# Patient Record
Sex: Female | Born: 1970
Health system: Southern US, Community
[De-identification: ages and names within clinical notes are randomized; demographics above are authoritative.]

## PROBLEM LIST (undated history)

## (undated) DIAGNOSIS — T7840XA Allergy, unspecified, initial encounter: Secondary | ICD-10-CM

## (undated) DIAGNOSIS — G43909 Migraine, unspecified, not intractable, without status migrainosus: Secondary | ICD-10-CM

## (undated) DIAGNOSIS — K219 Gastro-esophageal reflux disease without esophagitis: Secondary | ICD-10-CM

## (undated) DIAGNOSIS — F419 Anxiety disorder, unspecified: Secondary | ICD-10-CM

## (undated) HISTORY — DX: Allergy, unspecified, initial encounter: T78.40XA

## (undated) HISTORY — DX: Anxiety disorder, unspecified: F41.9

## (undated) HISTORY — PX: RHINOPLASTY: SUR1284

## (undated) HISTORY — DX: Migraine, unspecified, not intractable, without status migrainosus: G43.909

## (undated) HISTORY — DX: Gastro-esophageal reflux disease without esophagitis: K21.9

---

## 1997-10-16 ENCOUNTER — Other Ambulatory Visit: Admission: RE | Admit: 1997-10-16 | Discharge: 1997-10-16 | Payer: Self-pay | Admitting: Gynecology

## 1997-11-08 ENCOUNTER — Inpatient Hospital Stay (HOSPITAL_COMMUNITY): Admission: AD | Admit: 1997-11-08 | Discharge: 1997-11-10 | Payer: Self-pay | Admitting: Gynecology

## 1997-11-08 ENCOUNTER — Inpatient Hospital Stay (HOSPITAL_COMMUNITY): Admission: AD | Admit: 1997-11-08 | Discharge: 1997-11-08 | Payer: Self-pay | Admitting: Gynecology

## 1998-01-01 ENCOUNTER — Other Ambulatory Visit: Admission: RE | Admit: 1998-01-01 | Discharge: 1998-01-01 | Payer: Self-pay | Admitting: Obstetrics and Gynecology

## 1999-02-10 ENCOUNTER — Other Ambulatory Visit: Admission: RE | Admit: 1999-02-10 | Discharge: 1999-02-10 | Payer: Self-pay | Admitting: Obstetrics and Gynecology

## 1999-10-22 ENCOUNTER — Other Ambulatory Visit: Admission: RE | Admit: 1999-10-22 | Discharge: 1999-10-22 | Payer: Self-pay | Admitting: Gynecology

## 1999-12-15 ENCOUNTER — Ambulatory Visit (HOSPITAL_COMMUNITY): Admission: RE | Admit: 1999-12-15 | Discharge: 1999-12-15 | Payer: Self-pay | Admitting: Obstetrics and Gynecology

## 1999-12-15 ENCOUNTER — Encounter: Payer: Self-pay | Admitting: Obstetrics and Gynecology

## 2000-02-24 ENCOUNTER — Ambulatory Visit (HOSPITAL_COMMUNITY): Admission: RE | Admit: 2000-02-24 | Discharge: 2000-02-24 | Payer: Self-pay | Admitting: Obstetrics and Gynecology

## 2000-05-06 ENCOUNTER — Encounter (INDEPENDENT_AMBULATORY_CARE_PROVIDER_SITE_OTHER): Payer: Self-pay

## 2000-05-06 ENCOUNTER — Inpatient Hospital Stay (HOSPITAL_COMMUNITY): Admission: AD | Admit: 2000-05-06 | Discharge: 2000-05-07 | Payer: Self-pay | Admitting: Gynecology

## 2000-06-17 ENCOUNTER — Other Ambulatory Visit: Admission: RE | Admit: 2000-06-17 | Discharge: 2000-06-17 | Payer: Self-pay | Admitting: Obstetrics and Gynecology

## 2001-05-11 HISTORY — PX: AUGMENTATION MAMMAPLASTY: SUR837

## 2002-05-10 ENCOUNTER — Other Ambulatory Visit: Admission: RE | Admit: 2002-05-10 | Discharge: 2002-05-10 | Payer: Self-pay | Admitting: Obstetrics and Gynecology

## 2003-04-17 ENCOUNTER — Encounter: Admission: RE | Admit: 2003-04-17 | Discharge: 2003-04-17 | Payer: Self-pay | Admitting: Family Medicine

## 2003-06-05 ENCOUNTER — Other Ambulatory Visit: Admission: RE | Admit: 2003-06-05 | Discharge: 2003-06-05 | Payer: Self-pay | Admitting: Obstetrics and Gynecology

## 2004-03-24 ENCOUNTER — Ambulatory Visit: Payer: Self-pay | Admitting: Family Medicine

## 2004-04-16 ENCOUNTER — Ambulatory Visit: Payer: Self-pay | Admitting: Family Medicine

## 2004-06-11 ENCOUNTER — Ambulatory Visit: Payer: Self-pay | Admitting: Family Medicine

## 2005-03-13 ENCOUNTER — Other Ambulatory Visit: Admission: RE | Admit: 2005-03-13 | Discharge: 2005-03-13 | Payer: Self-pay | Admitting: Gynecology

## 2005-06-19 ENCOUNTER — Ambulatory Visit: Payer: Self-pay | Admitting: Family Medicine

## 2006-02-25 ENCOUNTER — Ambulatory Visit: Payer: Self-pay | Admitting: Family Medicine

## 2006-05-11 HISTORY — PX: ENDOMETRIAL ABLATION: SHX621

## 2006-05-12 ENCOUNTER — Other Ambulatory Visit: Admission: RE | Admit: 2006-05-12 | Discharge: 2006-05-12 | Payer: Self-pay | Admitting: Gynecology

## 2006-08-26 ENCOUNTER — Ambulatory Visit (HOSPITAL_BASED_OUTPATIENT_CLINIC_OR_DEPARTMENT_OTHER): Admission: RE | Admit: 2006-08-26 | Discharge: 2006-08-26 | Payer: Self-pay | Admitting: Gynecology

## 2006-10-26 ENCOUNTER — Ambulatory Visit: Payer: Self-pay | Admitting: Family Medicine

## 2007-01-03 ENCOUNTER — Ambulatory Visit: Payer: Self-pay | Admitting: Vascular Surgery

## 2007-02-28 ENCOUNTER — Telehealth: Payer: Self-pay | Admitting: Family Medicine

## 2007-04-11 ENCOUNTER — Ambulatory Visit: Payer: Self-pay | Admitting: Vascular Surgery

## 2007-04-28 ENCOUNTER — Ambulatory Visit: Payer: Self-pay | Admitting: Vascular Surgery

## 2007-05-19 ENCOUNTER — Ambulatory Visit: Payer: Self-pay | Admitting: Vascular Surgery

## 2007-08-08 ENCOUNTER — Telehealth: Payer: Self-pay | Admitting: Family Medicine

## 2007-08-26 ENCOUNTER — Ambulatory Visit: Payer: Self-pay | Admitting: Family Medicine

## 2007-08-26 DIAGNOSIS — G4700 Insomnia, unspecified: Secondary | ICD-10-CM | POA: Insufficient documentation

## 2007-08-26 DIAGNOSIS — H6120 Impacted cerumen, unspecified ear: Secondary | ICD-10-CM

## 2007-08-26 DIAGNOSIS — R51 Headache: Secondary | ICD-10-CM

## 2007-08-26 DIAGNOSIS — F418 Other specified anxiety disorders: Secondary | ICD-10-CM

## 2007-08-26 DIAGNOSIS — R519 Headache, unspecified: Secondary | ICD-10-CM | POA: Insufficient documentation

## 2007-10-05 ENCOUNTER — Ambulatory Visit: Payer: Self-pay | Admitting: Family Medicine

## 2007-10-05 DIAGNOSIS — N3000 Acute cystitis without hematuria: Secondary | ICD-10-CM | POA: Insufficient documentation

## 2007-10-05 LAB — CONVERTED CEMR LAB
Bilirubin Urine: NEGATIVE
Blood in Urine, dipstick: NEGATIVE
Glucose, Urine, Semiquant: NEGATIVE
Ketones, urine, test strip: NEGATIVE
Nitrite: NEGATIVE
Protein, U semiquant: NEGATIVE
Specific Gravity, Urine: <1.005
Urobilinogen, UA: 0.2
WBC Urine, dipstick: NEGATIVE
pH: 7

## 2007-12-06 ENCOUNTER — Telehealth: Payer: Self-pay | Admitting: Family Medicine

## 2007-12-07 ENCOUNTER — Ambulatory Visit: Payer: Self-pay | Admitting: Family Medicine

## 2007-12-07 DIAGNOSIS — J1189 Influenza due to unidentified influenza virus with other manifestations: Secondary | ICD-10-CM

## 2008-01-18 ENCOUNTER — Ambulatory Visit: Payer: Self-pay | Admitting: Women's Health

## 2008-01-18 ENCOUNTER — Encounter: Payer: Self-pay | Admitting: Women's Health

## 2008-01-18 ENCOUNTER — Other Ambulatory Visit: Admission: RE | Admit: 2008-01-18 | Discharge: 2008-01-18 | Payer: Self-pay | Admitting: Gynecology

## 2008-02-27 ENCOUNTER — Telehealth: Payer: Self-pay | Admitting: Family Medicine

## 2008-03-19 ENCOUNTER — Telehealth: Payer: Self-pay | Admitting: Family Medicine

## 2008-05-25 ENCOUNTER — Telehealth: Payer: Self-pay | Admitting: Family Medicine

## 2008-05-25 ENCOUNTER — Ambulatory Visit: Payer: Self-pay | Admitting: Women's Health

## 2008-06-14 ENCOUNTER — Ambulatory Visit: Payer: Self-pay | Admitting: Women's Health

## 2008-06-15 ENCOUNTER — Ambulatory Visit: Payer: Self-pay | Admitting: Women's Health

## 2008-06-15 ENCOUNTER — Inpatient Hospital Stay (HOSPITAL_COMMUNITY): Admission: AD | Admit: 2008-06-15 | Discharge: 2008-06-15 | Payer: Self-pay | Admitting: Obstetrics and Gynecology

## 2008-06-18 ENCOUNTER — Ambulatory Visit: Payer: Self-pay | Admitting: Women's Health

## 2008-06-18 ENCOUNTER — Inpatient Hospital Stay (HOSPITAL_COMMUNITY): Admission: AD | Admit: 2008-06-18 | Discharge: 2008-06-18 | Payer: Self-pay | Admitting: Gynecology

## 2008-06-18 ENCOUNTER — Ambulatory Visit: Payer: Self-pay | Admitting: Gynecology

## 2008-06-21 ENCOUNTER — Ambulatory Visit: Payer: Self-pay | Admitting: Gynecology

## 2008-06-26 ENCOUNTER — Ambulatory Visit: Payer: Self-pay | Admitting: Gynecology

## 2008-06-26 ENCOUNTER — Encounter: Payer: Self-pay | Admitting: Gynecology

## 2008-06-26 ENCOUNTER — Ambulatory Visit (HOSPITAL_BASED_OUTPATIENT_CLINIC_OR_DEPARTMENT_OTHER): Admission: RE | Admit: 2008-06-26 | Discharge: 2008-06-27 | Payer: Self-pay | Admitting: Gynecology

## 2008-06-26 HISTORY — PX: VAGINAL HYSTERECTOMY: SUR661

## 2008-07-03 ENCOUNTER — Ambulatory Visit: Payer: Self-pay | Admitting: Gynecology

## 2008-07-11 ENCOUNTER — Ambulatory Visit: Payer: Self-pay | Admitting: Gynecology

## 2008-08-06 ENCOUNTER — Ambulatory Visit: Payer: Self-pay | Admitting: Gynecology

## 2008-09-06 ENCOUNTER — Telehealth: Payer: Self-pay | Admitting: Family Medicine

## 2008-10-30 ENCOUNTER — Ambulatory Visit: Payer: Self-pay | Admitting: Family Medicine

## 2008-10-30 DIAGNOSIS — F411 Generalized anxiety disorder: Secondary | ICD-10-CM

## 2009-03-04 ENCOUNTER — Encounter: Payer: Self-pay | Admitting: Family Medicine

## 2009-03-20 ENCOUNTER — Telehealth: Payer: Self-pay | Admitting: Family Medicine

## 2009-04-13 ENCOUNTER — Encounter (INDEPENDENT_AMBULATORY_CARE_PROVIDER_SITE_OTHER): Payer: Self-pay | Admitting: *Deleted

## 2009-11-22 ENCOUNTER — Ambulatory Visit: Payer: Self-pay | Admitting: Women's Health

## 2009-11-22 ENCOUNTER — Other Ambulatory Visit: Admission: RE | Admit: 2009-11-22 | Discharge: 2009-11-22 | Payer: Self-pay | Admitting: Gynecology

## 2010-04-18 ENCOUNTER — Telehealth: Payer: Self-pay | Admitting: Family Medicine

## 2010-04-24 ENCOUNTER — Ambulatory Visit: Payer: Self-pay | Admitting: Family Medicine

## 2010-06-01 ENCOUNTER — Encounter: Payer: Self-pay | Admitting: Family Medicine

## 2010-06-05 ENCOUNTER — Ambulatory Visit: Admit: 2010-06-05 | Payer: Self-pay | Admitting: Family Medicine

## 2010-06-12 ENCOUNTER — Ambulatory Visit (INDEPENDENT_AMBULATORY_CARE_PROVIDER_SITE_OTHER): Payer: 59 | Admitting: Women's Health

## 2010-06-12 DIAGNOSIS — R350 Frequency of micturition: Secondary | ICD-10-CM

## 2010-06-12 DIAGNOSIS — N644 Mastodynia: Secondary | ICD-10-CM

## 2010-06-12 NOTE — Assessment & Plan Note (Signed)
Summary: med check/refills/cjr   Vital Signs:  Patient profile:   40 year old female Weight:      108 pounds Temp:     98.5 degrees F oral BP sitting:   92 / 60  Vitals Entered By: Lynann Beaver CMA AAMA (April 24, 2010 8:31 AM) CC: refill meds Is Patient Diabetic? No Pain Assessment Patient in pain? no        History of Present Illness: Here to follow up on depression, insomnia, and migraines. She has been doing very well in general, and her HAs are much better lately, averaging about one a month. her moods are stable. She is doing yoga at home and eating a healthy diet.   Allergies: 1)  ! Codeine 2)  ! * Antihistamines  Past History:  Past Medical History: Reviewed history from 10/30/2008 and no changes required. Depression Headache insomnia sees Dr. Lily Peer or Maryelizabeth Rowan PA for gyn exams  Past Surgical History: Reviewed history from 10/30/2008 and no changes required. endometrial ablation 4-08 per Dr. Lily Peer breast augmentations hysterectomy with intact ovaries 2-10 per Dr. Lily Peer  Review of Systems  The patient denies anorexia, fever, weight loss, weight gain, vision loss, decreased hearing, hoarseness, chest pain, syncope, dyspnea on exertion, peripheral edema, prolonged cough, headaches, hemoptysis, abdominal pain, melena, hematochezia, severe indigestion/heartburn, hematuria, incontinence, genital sores, muscle weakness, suspicious skin lesions, transient blindness, difficulty walking, depression, unusual weight change, abnormal bleeding, enlarged lymph nodes, angioedema, breast masses, and testicular masses.    Physical Exam  General:  Well-developed,well-nourished,in no acute distress; alert,appropriate and cooperative throughout examination Neck:  No deformities, masses, or tenderness noted. Lungs:  Normal respiratory effort, chest expands symmetrically. Lungs are clear to auscultation, no crackles or wheezes. Heart:  Normal rate and regular  rhythm. S1 and S2 normal without gallop, murmur, click, rub or other extra sounds. Neurologic:  alert & oriented X3, cranial nerves II-XII intact, and gait normal.   Psych:  Cognition and judgment appear intact. Alert and cooperative with normal attention span and concentration. No apparent delusions, illusions, hallucinations   Impression & Recommendations:  Problem # 1:  ANXIETY DISORDER (ICD-300.00)  The following medications were removed from the medication list:    Lexapro 20 Mg Tabs (Escitalopram oxalate) ..... Once daily Her updated medication list for this problem includes:    Citalopram Hydrobromide 40 Mg Tabs (Citalopram hydrobromide) ..... Once daily  Problem # 2:  INSOMNIA (ICD-780.52)  Her updated medication list for this problem includes:    Temazepam 30 Mg Caps (Temazepam) .Marland Kitchen... At bedtime  Problem # 3:  DEPRESSION (ICD-311)  The following medications were removed from the medication list:    Lexapro 20 Mg Tabs (Escitalopram oxalate) ..... Once daily Her updated medication list for this problem includes:    Citalopram Hydrobromide 40 Mg Tabs (Citalopram hydrobromide) ..... Once daily  Problem # 4:  HEADACHE (ICD-784.0)  Her updated medication list for this problem includes:    Imitrex 100 Mg Tabs (Sumatriptan succinate) .Marland Kitchen... As needed  Complete Medication List: 1)  Imitrex 100 Mg Tabs (Sumatriptan succinate) .... As needed 2)  Temazepam 30 Mg Caps (Temazepam) .... At bedtime 3)  Citalopram Hydrobromide 40 Mg Tabs (Citalopram hydrobromide) .... Once daily  Other Orders: Admin 1st Vaccine (16109) Flu Vaccine 4yrs + (60454)  Patient Instructions: 1)  set up fasting labs soon Prescriptions: CITALOPRAM HYDROBROMIDE 40 MG TABS (CITALOPRAM HYDROBROMIDE) once daily  #30 x 11   Entered and Authorized by:   Nelwyn Salisbury MD  Signed by:   Nelwyn Salisbury MD on 04/24/2010   Method used:   Print then Give to Patient   RxID:   [id number] TEMAZEPAM 30 MG CAPS  (TEMAZEPAM) at bedtime  #30 x 5   Entered and Authorized by:   Nelwyn Salisbury MD   Signed by:   Nelwyn Salisbury MD on 04/24/2010   Method used:   Print then Give to Patient   RxID:   [id number] IMITREX 100 MG TABS (SUMATRIPTAN SUCCINATE) as needed  #9 x 11   Entered and Authorized by:   Nelwyn Salisbury MD   Signed by:   Nelwyn Salisbury MD on 04/24/2010   Method used:   Print then Give to Patient   RxID:   [id number]    Orders Added: 1)  Est. Patient Level IV [53664] 2)  Admin 1st Vaccine [40347] 3)  Flu Vaccine 9yrs + [42595] Flu Vaccine Consent Questions     Do you have a history of severe allergic reactions to this vaccine? no    Any prior history of allergic reactions to egg and/or gelatin? no    Do you have a sensitivity to the preservative Thimersol? no    Do you have a past history of Guillan-Barre Syndrome? no    Do you currently have an acute febrile illness? no    Have you ever had a severe reaction to latex? no    Vaccine information given and explained to patient? yes    Are you currently pregnant? no    Lot Number:[id number]   Exp Date:11/08/2010   Site Given  Left Deltoid IMn 1st Vaccine [90471] 3)  Flu Vaccine 105yrs + [63875]     .lbflu

## 2010-06-12 NOTE — Progress Notes (Signed)
Summary: Pt sch ov. Needs refill of Temazepam asap to [facility]  Phone Note Call from Patient Call back at Work Phone [phone]   Caller: Patient Summary of Call: Pt has sch ov for 04/24/10. Only has to Temazepam lft. Pls call in refill to [facility] at [employer].  Initial call taken by: Lucy Antigua,  April 18, 2010 8:52 AM  Follow-up for Phone Call        #30 only. OV as previously noted Follow-up by: Edwyna Perfect MD,  April 18, 2010 9:16 AM  Additional Follow-up for Phone Call Additional follow up Details #1::        done pt aware.  Additional Follow-up by: Pura Spice, RN,  April 18, 2010 9:21 AM

## 2010-07-15 ENCOUNTER — Ambulatory Visit (INDEPENDENT_AMBULATORY_CARE_PROVIDER_SITE_OTHER): Payer: 59 | Admitting: Family Medicine

## 2010-07-15 ENCOUNTER — Encounter: Payer: Self-pay | Admitting: Family Medicine

## 2010-07-15 ENCOUNTER — Telehealth: Payer: Self-pay | Admitting: Family Medicine

## 2010-07-15 DIAGNOSIS — F411 Generalized anxiety disorder: Secondary | ICD-10-CM

## 2010-07-15 DIAGNOSIS — F419 Anxiety disorder, unspecified: Secondary | ICD-10-CM

## 2010-07-15 DIAGNOSIS — R319 Hematuria, unspecified: Secondary | ICD-10-CM

## 2010-07-15 DIAGNOSIS — N390 Urinary tract infection, site not specified: Secondary | ICD-10-CM

## 2010-07-15 LAB — POCT URINALYSIS DIPSTICK
Bilirubin, UA: NEGATIVE
Glucose, UA: NEGATIVE
Ketones, UA: NEGATIVE
Nitrite, UA: NEGATIVE
Spec Grav, UA: 1.02
Urobilinogen, UA: 0.2
pH, UA: 7

## 2010-07-15 MED ORDER — CIPROFLOXACIN HCL 500 MG PO TABS: 500.0000 mg | ORAL_TABLET | Freq: Two times a day (BID) | ORAL | Status: AC

## 2010-07-15 MED ORDER — FLUCONAZOLE 150 MG PO TABS: 150.0000 mg | ORAL_TABLET | Freq: Once | ORAL | Status: AC

## 2010-07-15 MED ORDER — ESCITALOPRAM OXALATE 20 MG PO TABS: 20.0000 mg | ORAL_TABLET | Freq: Every day | ORAL | Status: DC

## 2010-07-15 NOTE — Telephone Encounter (Signed)
Ok to work in at [employer]

## 2010-07-15 NOTE — Telephone Encounter (Signed)
Pt called and said that there is blood in urine. Discomfort. Ovaries sore. Pt req work in appt.

## 2010-07-15 NOTE — Telephone Encounter (Signed)
lft vm for pt stating that Dr Clent Ridges has agreed to see pt today at 3:30pm. Pt has been schd for this time and is suppose to call back to confirm appt.

## 2010-07-15 NOTE — Progress Notes (Signed)
  Subjective:    Patient ID: Linda [name], female    DOB: 10-14-1970, 40 y.o.   MRN: [id number]  HPI    Review of Systems  Constitutional: Negative.   Gastrointestinal: Negative.   Genitourinary: Positive for urgency and frequency. Negative for dysuria, flank pain, vaginal pain and pelvic pain.  Psychiatric/Behavioral: Positive for sleep disturbance. The patient is nervous/anxious.    Here for several issues. First for 3 days she has seen blood in the urine. There is some urgency to urinate but no burning or pain. No fever or pain. Drinking water. Also she has been taking Celexa for anxiety for awhile but she is not a ssatisfied with this as she was when taking Lexapro. She wants to go back to this.     Objective:   Physical Exam  Constitutional: She appears well-developed and well-nourished.  Abdominal: Soft. Bowel sounds are normal. She exhibits no distension and no mass. There is no tenderness. There is no rebound and no guarding.  Psychiatric: She has a normal mood and affect. Her behavior is normal. Judgment and thought content normal.          Assessment & Plan:  She has a UTI, treat with antibiotic. Switch back to Lexapro.

## 2010-08-06 ENCOUNTER — Other Ambulatory Visit: Payer: Self-pay | Admitting: Family Medicine

## 2010-08-06 DIAGNOSIS — Z1231 Encounter for screening mammogram for malignant neoplasm of breast: Secondary | ICD-10-CM

## 2010-08-18 ENCOUNTER — Ambulatory Visit
Admission: RE | Admit: 2010-08-18 | Discharge: 2010-08-18 | Disposition: A | Discharge status: Home / Self Care | Payer: 59 | Source: Ambulatory Visit | Attending: Family Medicine | Admitting: Family Medicine

## 2010-08-18 DIAGNOSIS — Z1231 Encounter for screening mammogram for malignant neoplasm of breast: Secondary | ICD-10-CM

## 2010-08-26 LAB — CREATININE, SERUM
Creatinine, Ser: 0.5 mg/dL (ref 0.4–1.2)
GFR calc Af Amer: >60 mL/min (ref >60)
GFR calc non Af Amer: >60 mL/min (ref >60)

## 2010-08-26 LAB — CBC
Hemoglobin: 12.3 g/dL (ref 12.0–15.0)
Hemoglobin: 10.6 g/dL — ABNORMAL LOW (ref 12.0–15.0)
MCHC: 33.8 g/dL (ref 30.0–36.0)
RBC: 3.99 MIL/uL (ref 3.87–5.11)
RBC: 3.42 MIL/uL — ABNORMAL LOW (ref 3.87–5.11)
RDW: 13.3 % (ref 11.5–15.5)

## 2010-08-26 LAB — DIFFERENTIAL
Basophils Absolute: 0.4 10*3/uL — ABNORMAL HIGH (ref 0.0–0.1)
Basophils Relative: 3 % — ABNORMAL HIGH (ref 0–1)
Lymphocytes Relative: 14 % (ref 12–46)
Monocytes Absolute: 0.5 10*3/uL (ref 0.1–1.0)
Monocytes Relative: 4 % (ref 3–12)
Neutro Abs: 9.5 10*3/uL — ABNORMAL HIGH (ref 1.7–7.7)
Neutrophils Relative %: 79 % — ABNORMAL HIGH (ref 43–77)

## 2010-08-26 LAB — AST: AST: 15 U/L (ref 0–37)

## 2010-08-26 LAB — BUN: BUN: 8 mg/dL (ref 6–23)

## 2010-09-23 NOTE — Op Note (Signed)
Linda [name], Linda [name]              ACCOUNT NO.:  [id number]   MEDICAL RECORD NO.:  [id number]          PATIENT TYPE:  AMB   LOCATION:  [facility]                         FACILITY:  [facility]   PHYSICIAN:  Juan H. Lily Peer, M.D.DATE OF BIRTH:  1970/09/09   DATE OF PROCEDURE:  DATE OF DISCHARGE:                               OPERATIVE REPORT   FIRST ASSISTANT:  Timothy P. Fontaine, M.D.   INDICATIONS FOR OPERATION:  A 40 year old gravida 3, para 2, currently 8  to [redacted] weeks pregnant after an endometrial ablation 2 years ago.  Patient  had undergone medical elective termination attempts x2 and surgical  attempt for D&E, unsuccessful.  Patient was found with quantitative beta  hCGs as an outpatient.  Her titer started to decrease.  Right adnexal  cystic mass had been noted despite an intrauterine gestational sac with  a yolk-sac had been seen.  The patient wanted definitive surgery, did  not want to continue with the methotrexate and is scheduled to undergo a  laparoscopic-assisted vaginal hysterectomy, bilateral salpingectomy.  Surgery also been informed to rule out heterotropic pregnancy.   PREOPERATIVE DIAGNOSES:  1. Rule out missed abortion.  2. Rule out chronic ectopic.  3. Rule out heterotropic pregnancy.   POSTOPERATIVE DIAGNOSES:  1. Rule out missed abortion.  2. Rule out chronic ectopic.  3. Rule out heterotropic pregnancy.   ANESTHESIA:  General endotracheal anesthesia.   PROCEDURE PERFORMED:  Laparoscopic-assisted vaginal hysterectomy,  bilateral salpingectomy.   FINDINGS:  Patient with an 8-week size uterus and normal-appearing  fallopian tubes bilaterally sitting in the sub-cul-de-sac near the right  adnexa was a 3 x 5 cm ovoid mass, hemorrhagic blue tensed in appearance,  further this could be infarcted appendices epiploicae or an ectopic.  The mass was not adhered to the fallopian tube that was floating in the  cul-de-sac.  Otherwise, normal-appearing tubes bilaterally.   Normal-  appearing ovaries.  Normal-appearing liver surface area and gallbladder.  Appendix not visualized.   DESCRIPTION OF OPERATION:  After the patient was adequately counseled,  she was taken to the operating room where she underwent a successful  general endotracheal anesthesia.  Patient received a gram of cefotetan  for prophylaxis and had PSA stockings for DVT prophylaxis as well.  A  Foley catheter had been inserted to monitor urinary output during the  surgery.  The abdomen was prepped and draped in usual sterile fashion.  The legs were placed in low lithotomy position, and a small semilunar  incision was made the underneath the umbilicus followed by insertion of  10/11 mm operative laparoscope with the Optiview and the peritoneal  cavity was entered cautiously.  A pneumoperitoneum was established with  approximately 3 liters of carbon dioxide.  Two additional 5-mm ports  were placed under laparoscopic guidance in the right lower left abdomen.  A systematic infant inspection demonstrated the above-mentioned  findings.  The right utero-ovarian ligament was identified and with the  use of harmonic scalpel, the right utero-ovarian ligament was coapted  and transected as was the proximal fallopian tube and the broad and  cardinal ligaments and a bladder  flap was established to the lower  uterine segment.  Similar procedure was carried out on the contralateral  side and after both uterine arteries had been coapted and transected,  the procedure was then completed transvaginally.  Of note, the 3 x 5 cm  ovoid mass was left in the cul-de-sac to be retrieved during the vaginal  hysterectomy portion.  The patient's leg was then placed in high  lithotomy position.  A short weighted billed speculum was placed in  posterior vaginal vault.  The anterior and posterior cervical lip was  grasped with a Lahey thyroid clamp, 1% lidocaine with 1:100,000  epinephrine was infiltrated to the  cervicovaginal mucosa in a  circumferential manner.  A circumferential incision was made with a  scalpel.  A posterior colpotomy was established.  The long weighted  billed speculum was interchanged for the short, and both uterosacral  ligaments were clamped, cut, and suture ligated with 0-Vicryl suture and  tagged.  The anterior peritoneum was incised in effort to enter the  anterior cul-de-sac and the deeper retractor was in place.  The  remaining broad and cardinal ligaments were serially clamped, cut, and  suture ligated with 0-Vicryl suture, and the uterus and cervix was  passed off the operative field as well as the specimen of the floating 3  x 5 cm ovoid mass.  Each were properly identified for pathological  evaluation.  The vagina was irrigated copiously with normal saline  solution.  Both pedicles were assessed and were normal.  The posterior  vaginal mucosa and peritoneum cuff from 6 to 12 o'clock position were  closed with a running locking stitch of 0-Vicryl suture.  Then, the cuff  was closed with interrupted sutures of 0-Vicryl suture.  The vagina was  once again irrigated with normal saline solution.  Attention was then  placed to the laparoscopic portion.  Once again brought down to low  lithotomy position and the pneumoperitoneum was reestablished.  A  systematic inspection demonstrated both pedicles appeared to be dry as  some of the vaginal cuff was slightly friable so Arista hemostatic  particles were placed in the area of the vaginal cuff after the pelvic  cavity been copiously irrigated with normal saline solution; but of  note, prior to doing this both fallopian tubes were excised with the  harmonic scalpel after coapting and transecting up in the mesosalpinx  and retrieved individually and identified according.  The  pneumoperitoneum was removed.  The subumbilical fascial incision was not  reapproximated, and the skin was reapproximated with Xeroform gauze as   well as both 5-mm ports; and of note for low hemostasis, some of the  Arista hemostatic particles had been placed before the Dermabond was  placed.  For postoperative analgesia, 0.25% Marcaine was infiltrated on  all three port sites for a total of 10 cc.  Sponge count and needle  count were correct.  Blood loss from procedure was 275 cc.  Urine output  250 cc.  Lactated Ringer's 1200 cc.  The uterine weight was 137.1 grams.      Juan H. Lily Peer, M.D.  Electronically Signed     JHF/MEDQ  D:  06/26/2008  T:  06/26/2008  Job:  [id number]

## 2010-09-23 NOTE — Consult Note (Signed)
VASCULAR SURGERY CONSULTATION   [name], [name]  DOB:  [birth year]                                       01/03/2007  EAVWU#:[id number]   HISTORY OF PRESENT ILLNESS:  The patient is a 40 year old female who has  been having progressive symptomatic varicosities in both legs over the  last several years.  She notices a throbbing aching discomfort when she  is sitting or standing, particularly in the posterior calf and behind  the knees with the right worse than the left.  This becomes  progressively worsen the more she stands and sits, which her job  requires.  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  She has no swelling in her  ankles and feet and has no history of bleeding or ulcerations,  thrombophlebitis or deep venous thrombosis, but primarily pain from  these varicosities and spider veins.  She recently was evaluated at the  [facility] and had an ultrasound study, but does not have that  report with her today.  She has not work elastic compression stockings  (full length), but has wrapped her legs with Ace wraps with some  improvement.  She also gets some improvement with elevation of the legs  and taking ibuprofen.   PAST MEDICAL HISTORY:  Negative for diabetes, coronary artery disease,  stroke, hyperlipidemia or COPD.   PAST SURGICAL HISTORY:  Breast augmentation surgery.   FAMILY HISTORY:  Negative for coronary artery disease, diabetes and  stroke.   SOCIAL HISTORY:  She is married and has two children.  She works as a  Social research officer, government.  She does not use tobacco.  She drinks occasional  alcohol.   ALLERGIES:  None known.   REVIEW OF SYSTEMS:  Please health history form.   MEDICATIONS:  Please health history form.   PHYSICAL EXAMINATION:  VITAL SIGNS:  Blood pressure is 118/80.  Heart  rate 68.  Respirations 16.  GENERAL APPEARANCE:  She is a healthy middle-  aged female in no  apparent distress.  She is alert and oriented x3.  NECK:  Supple.  Carotid pulses 3+ palpable.  No bruits.  There is no  palpable adenopathy in the neck.  NEUROLOGIC EXAM:  Normal.  CHEST:  Clear to auscultation.  CARDIOVASCULAR EXAM:  Regular rhythm with no  murmurs.  ABDOMEN:  Soft and nontender with no masses palpable.  EXTREMITIES:  She has  3+ femoral, popliteal, dorsalis pedis and posterior tibial pulses  bilaterally.  There is no distal edema, hyperpigmentation or ulceration  noted.  The right leg has significant varicosities, particularly in the  distal thigh and the popliteal fossa posteriorly with some bulging.  There are extensive spider veins on the right leg in the lateral thigh,  medial thigh and posterior calf region.  On the left side, there are  some small varicosities in her popliteal area, but primarily spider  veins in the proximal, medial and lateral thigh as well as down the  lateral calf area.   ASSESSMENT:  I feel that she does have symptomatic varicosities with the  right worse than the left.  I do not have the results of her ultrasound  today to make any recommendations for treatment and she will obtain this  and I will then review this and decide  what options are best for this  young lady.   Quita Skye Hart Rochester, M.D.  Electronically Signed  JDL/MEDQ  D:  01/03/2007  T:  01/04/2007  Job:  [id number]   cc:   Tinnie Gens A. Tawanna Cooler, MD

## 2010-09-23 NOTE — Procedures (Signed)
LOWER EXTREMITY VENOUS REFLUX EXAM   INDICATION:  Bilateral lower extremity spider veins and varicose veins.   EXAM:  Using color-flow imaging and pulse Doppler spectral analysis, the  right and left common femoral, superficial femoral, popliteal, posterior  tibial, greater and lesser saphenous veins are evaluated.  There is no  evidence suggesting deep venous insufficiency in the right or left lower  extremity.   The right and left saphenofemoral junction are competent.  The left GSV  is competent to the level of the proximal calf.  There is an incompetent  tributary branch at the proximal calf which gives rise to a cluster of  varicose veins.   The right and left proximal short saphenous vein demonstrates  competency.   GSV Diameter (used if found to be incompetent only)                                            Right    Left  Proximal Greater Saphenous Vein           cm       cm  Proximal-to-mid-thigh                     cm       cm  Mid thigh                                 cm       cm  Mid-distal thigh                          cm       cm  Distal thigh                              cm       cm  Knee                                      cm       cm   IMPRESSION:  1. Right and left greater saphenous veins are competent bilaterally.  2. The right/left greater saphenous vein are not aneurysmal.  3. The right/left greater saphenous vein are not tortuous.  4. There is no evidence of significant deep venous insufficiency in      the left lower extremity.  5. The deep venous system is competent.  6. The right and left lesser saphenous vein are competent.   There is a cluster of right lower leg varicose veins which communicate  with the greater saphenous vein in two places in the proximal calf and  one at the ankle.   The cluster of varicose veins also communicates with the posterior  tibial vein by an incompetent perforator at the distal third of the   calf.   ___________________________________________  Quita Skye. Hart [place], M.D.   MC/MEDQ  D:  04/11/2007  T:  04/12/2007  Job:  [id number]

## 2010-09-23 NOTE — Assessment & Plan Note (Signed)
OFFICE VISIT   BERRY, Tonianne L  DOB:  1971-02-20                                       04/11/2007  ZOXWR#:[id number]   Ms. Linda [name] returns today for further evaluation of her painful  varicosities in the right leg. She has been wearing elastic compression  stockings from ankle to groin (20-30 mm) which have improved her  symptomatology slightly while standing, but she does continue to have  symptoms which are affecting her daily living. 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 She has tried ibuprofen  as well without relief. She had a venous Duplex exam performed in our  office today. No obvious reflux was noted in the right greater saphenous  vein from the groin to the knee. She does have a prominent branch  originating from the saphenous vein below the knee which seems to  communicate with the varicosities which are symptomatic in the right  pretibial region anteriorly. She also has painful varicosities in the  posterior proximal calf and popliteal area to extend into the distal  thigh. There is no reflux in the right small saphenous vein. She has a  large nest of spider veins in the thigh both medially and laterally and  no distal edema is noted today.   She is having significant pain with this varicosities which are  affecting her daily living, and I think they should be treated by  sclerotherapy at this time. This will require approximately three  sessions and we will proceed with precertification for this procedure to  treat these painful varicosities.   Quita Skye Hart [place], M.D.  Electronically Signed   JDL/MEDQ  D:  04/11/2007  T:  04/12/2007  Job:  [id number]

## 2010-09-23 NOTE — Assessment & Plan Note (Signed)
[facility]                                 ON-CALL NOTE   SHAKEA, ISIP                       MRN:          [id number]  DATE:10/02/2007                            DOB:          April 09, 1971    Time is 12:49 p.m., phone number is [phone].  The patient is out of town, has a bladder infection for couple of days,  is now having hematuria, went to an Urgent Care, they are very busy,  would like the medicine called in.  I told her we could do that, but  need to be seen, evaluated, and we will not be open tomorrow.   Primary care Shayn Madole is Dr. Clent Ridges and home office is [place].     Arta Silence, MD  Electronically Signed    RNS/MedQ  DD: 10/02/2007  DT: 10/03/2007  Job #: [id number]

## 2010-09-23 NOTE — H&P (Signed)
Linda [name], BOTH              ACCOUNT NO.:  [id number]   MEDICAL RECORD NO.:  [id number]          PATIENT TYPE:  AMB   LOCATION:  [facility]                         FACILITY:  [facility]   PHYSICIAN:  Juan H. Lily Peer, M.D.DATE OF BIRTH:  [birth year]   DATE OF ADMISSION:  06/26/2008  DATE OF DISCHARGE:                              HISTORY & PHYSICAL   The patient is scheduled for surgery on Tuesday, February 16th, at [facility], please have history and physical available.   CHIEF COMPLAINT:  Chronic broad ectopic pregnancy versus heterotropic  pregnancy versus missed AB with a right ovarian paratubal cyst.   HISTORY OF PRESENT ILLNESS:  The patient is a 40 year old gravida 3,  para 2 with prior history of endometrial ablation via the VaporTrode  technique in April 2008.  The patient's husband had a vasectomy.  The  patient went through a separation, had intercourse with different  partner with no contraception and conceived.  She then went to [facility] where she was given mifepristone which she took for 2 days  and stated she had 2 ultrasound and one had been done in our office  prior to her decision to proceed with the termination which had  demonstrated that she had a gestational sac with yolk sac intrauterine.  She then continued to spot and went to an elective termination clinic  here in [place] and they had difficulty getting into the uterus  because it appears as a result of her ablation the endometrial walls had  been agglutinated, so the case was terminated and was referred back to  our office for followup.  She was followed with serial quantitative beta  HCGs, the first on February 8 with a value of 76,876.  She also had an  ultrasound which was demonstrated on February 5th, to demonstrate a  right adnexal hyperechoic avascular mass measuring 30 mm x 24 mm  separate from the right ovary and no free fluid, and the cul-de-sac was  seen.  The  intrauterine sac that previously was seen, was seen now at  this time smaller on the followup ultrasound, and no fetal pole or yolk  sac in the left ovary and was normal.  This case had been declined since  it was described that she had injury on gestational sac on several  occasion, now this apparent hypercarbia vascular in the right adnexa  that measured 30 mm x 24 mm with a quant of [id number], whether this could  be an incomplete AB or the remote possibility of an heterotropic  pregnancy was entertaining diagnosis.  Since this adnexal mass measured  less than 3.5 cm, there was no cardiac activity, was avascular.  We  decided to proceed with a trial of methotrexate which she received 15 mg  per meter square IM on day #1 and on day #4.  Her quant's were as  follows, June 14, 2008, it was 91,478; on February 8th, it was  69,471; and on day #7, after initiation of methotrexate it dropped to  58,125, had dropped greater than 15% and the patient during all this  entire time had remained hemodynamically stable and asymptomatic with no  abdominal tenderness, guarding, or rebound.  She stated that she has  been emotionally distraught and wanted to proceed with definitive  surgery, so she is scheduled to undergo a laparoscopic assisted vaginal  hysterectomy with bilateral salpingectomy, and possible right  salpingooophorectomy.  The patient denies any allergies.  Her medical  condition consisted of anxiety for she takes Zoloft 100 mg daily,  temazepam 3 mg daily, and Zomig p.r.n.   PAST SURGICAL HISTORY:  1. Surgeries consisted of breast augmentation in 2003.  2. She has had 2 normal spontaneous vaginal delivery and an      endometrial ablation, VaporTrode technique in April 2008.   FAMILY HISTORY:  Mother with non-Hodgkin lymphoma.   PHYSICAL EXAMINATION:  GENERAL:  The patient weighs approximately 103  pounds.  HEENT:  Unremarkable.  NECK:  Supple.  Trachea midline.  No carotid bruits or  thyromegaly.  LUNGS:  Clear to auscultation without rhonchi or wheezes.  HEART:  Regular rate and rhythm.  No murmurs or gallops.  BREAST:  Not done.  ABDOMEN:  Soft and nontender.  No rebound or guarding.  PELVIC:  Bartholin, urethra, and Skene's are within normal limits.  VAGINA AND CERVIX:  No gross lesion on inspection, although she had been  complaining of vaginal discharge with a GC and chlamydia culture along  with a wet prep was done.  UTERUS:  Anteverted; normal size, shape, and consistency.  ADNEXA:  No palpable mass or tenderness.  Right adnexa, no rebound or  guarding and no discernible mass.  RECTAL:  Exam deferred.   ASSESSMENT:  A 40 year old gravida 3, para 2 with prior history of  endometrial ablation via VaporTrode technique in April 2008, had  conceived after an unprotected intercourse with different partner after  separation from her spouse.  Attempted medical as well as surgical DNE,  unsuccessful.  The patient monitored with serial quantitative HCGs after  initiated methotrexate with greater than 15% declined on the quant  levels with a persistent right adnexal cystic mass measuring 3 cm with  no vascularity, no fetal pole noted.  The entertaining diagnosis is  heterotropic ectopic versus a missed AB which has not been able to pass  due to the fact that the occluding uterine wall have prevent  accessibility through a curettage and for regression of the products of  conception with a concurrent possible paratubal cyst.  For this reason  and for the patient's anxiety and apprehension, she is scheduled to  undergo laparoscopic-assisted hysterectomy, bilateral salpingectomy, and  possible right salpingooophorectomy.  The risks, benefits, and pros and  cons of the operation were discussed with the patient to include  infection.  She will receive prophylaxis antibiotic.  The risk for deep  venous thrombosis and subsequent pulmonary embolism were discussed as  well as  hemorrhage, if she would bleeding and need a blood transfusion.  She is fully aware of potential risk of 1:100,000 risk for hepatitis,  AIDS, and anaphylactic reaction.  All these issues were discussed with  the patient as well as the risk of trauma to internal organs or the  inability to complete the operation laparoscopically.  An open abdominal  approach may need to be utilized.  All these issues were discussed in  detail.  The patient is fully aware that she will not be able to have  anymore children, although she has had an ablation in the past, but  nevertheless she is fully  aware that this is 100%, that she will not be  able to get pregnant in the future and she fully accepts the above-  mentioned risk, and literature information had been provided.   PLAN:  As per assessment above.      Juan H. Lily Peer, M.D.  Electronically Signed     JHF/MEDQ  D:  06/21/2008  T:  06/21/2008  Job:  [id number]

## 2010-09-26 NOTE — Op Note (Signed)
Linda [name], Linda [name]              ACCOUNT NO.:  [id number]   MEDICAL RECORD NO.:  [id number]          PATIENT TYPE:  AMB   LOCATION:  [facility]                         [facility]ACILITY:  [facility]   PHYSICIAN:  Juan H. Lily Peer, M.D.DATE OF BIRTH:  January 27, 1971   DATE OF PROCEDURE:  08/26/2006  DATE OF DISCHARGE:                               OPERATIVE REPORT   SURGEON:  Juan H. Lily Peer, M.D.   INDICATIONS FOR OPERATION:  40 year old gravida 2, para 2 with  dysmenorrhea and menorrhagia.  Sonohysterogram had demonstrated normal  uterine cavity and endometrial biopsy with benign secretory endometrium.   PREOPERATIVE DIAGNOSES:  1. Menorrhagia  2. Dysmenorrhea.   POSTOPERATIVE DIAGNOSIS:  1. Menorrhagia  2. Dysmenorrhea.   PROCEDURES:  1. Attempted endometrial ablation via NovaSure technique.  2. VaporTrode endometrial ablation.   FINDINGS:  The patient at time of cavity assessment was found to have an  intrauterine cavity less than 2 cm in width, so the NovaSure endometrial  technique for ablation could not be utilized, and the patient  subsequently underwent VaporTrode endometrial ablation.  Also diagnostic  hysteroscopy.   DESCRIPTION OF PROCEDURE:  After the patient was adequately counseled,  she was taken to operating room where she underwent a successful general  endotracheal anesthesia.  The patient had received a gram of cefoxitin  for prophylaxis.  Her legs were placed in low lithotomy position.  The  vagina and perineum were prepped and draped in usual sterile fashion.  A  red rubber Roxan Hockey had been inserted to evacuate the bladder of its  contents.  Examination under anesthesia demonstrated a uterus that was  anteverted, normal size, although it appeared to be somewhat small with  no palpable adnexal masses.   A Graves speculum was inserted into the vaginal vault.  The anterior  cervical lip was grasped with single-tooth tenaculum.  The cervical  length measurement was 3 cm,  and the uterus sounded to 7.5 cm, for a  cavity length measurement of 4.5 cm.  The diagnostic hysteroscope had  been inserted after the cervix had been dilated to 8 mm in size.  Systematic inspection demonstrated a normal-appearing uterine cavity  with both tubal ostia being identified, although small cavity and normal  cervical canal.  The NovaSure apparatus was inserted into the  intrauterine cavity.  At time of measuring cavity width measurement, it  was less than 2.5 cm, so the NovaSure endometrial ablation technique  could not be performed safely.  The instrument was removed, and the  VaporTrode was introduced into the intrauterine cavity.  Sorbitol was  used, 3%, as a distending media.  With the [facility]  surgical generator set at 90 watts in the coagulation mode, the entire  endometrial cavity was ablated with the VaporTrode.  Pre and  postprocedure pictures were obtained.  The single-tooth tenaculum was  removed.   The patient was extubated and transferred to the recovery room with  stable vital signs.  Blood loss was minimal.  Fluid resuscitation  consisted of 900 mL of lactated Ringer's.  She was to receive 15 mg of  Toradol en route  to the recovery room.      Juan H. Lily Peer, M.D.  Electronically Signed     JHF/MEDQ  D:  08/26/2006  T:  08/26/2006  Job:  [id number]

## 2010-09-26 NOTE — H&P (Signed)
NAMEALLYSSIA, SKLUZACEK              ACCOUNT NO.:  [id number]   MEDICAL RECORD NO.:  [id number]        PATIENT TYPE:  HAMB   LOCATION:                               FACILITY:  [facility]   PHYSICIAN:  Juan H. Lily Peer, M.D.DATE OF BIRTH:  [birth year]   DATE OF ADMISSION:  08/26/2006  DATE OF DISCHARGE:                              HISTORY & PHYSICAL   Patient was scheduled for surgery Thursday, April 17 at 8:45.  Please  have history and physical available.   CHIEF COMPLAINT:  Menorrhagia.   HISTORY:  The patient is a 40 year old gravida 2, para 2, who had been  seen in the office on March 14 for a sonohysterogram due to the fact  that the patient had been complaining of severe dysmenorrhea and  menorrhagia at times since she had been on the NuvaRing.  She also  suffers from menstrual migraine and recently had been placed on a  continuous regimen to withdraw from the ring every 3 months.  Patient's  sonohysterogram demonstrated no intracavitary defects, the ovaries  appeared to be normal.  The patient had been considering endometrial  ablation, and she returned back to the office on March 20 for further  evaluation of ongoing menorrhagia.  She did have an endometrial biopsy  which demonstrated evidence of only a benign secretory endometrium, no  hyperplasia or malignancy was seen.  Her husband is in the process of  getting a vasectomy.  Nevertheless, she would like to proceed with  endometrial ablation.   PAST MEDICAL HISTORY:  She denies any allergies.  She is on Lexapro for  depression.  She has had breast augmentation in 2003.   FAMILY HISTORY:  Mother with non-Hodgkin's lymphoma.   PHYSICAL EXAMINATION:  VITAL SIGNS:  Patient weighs 107 pounds, 5 feet 5  inches tall.  HEENT:  Unremarkable.  NECK:  Supple.  Trachea midline.  No carotid bruits.  No thyromegaly.  LUNGS:  Clear to auscultation without rhonchi or wheezes.  HEART:  Regular rate and rhythm.  No murmurs or gallops.  BREAST:  Exam not done.  ABDOMEN:  Soft, nontender, without rebound or guarding.  PELVIC:  Bartholin's, urethral, and Skene's glands within normal limits.  VAGINA AND CERVIX:  No lesions or discharge.  Uterus anteverted; normal  size, shape and consistency.  Adnexa without any mass or tenderness.  RECTAL:  Exam deferred.   ASSESSMENT:  A 40 year old gravida 2, para 2, with menorrhagia, negative  sonohysterogram, negative endometrial biopsy, and negative Pap smear, is  scheduled to undergo endometrial ablation [facility] on  Thursday, April 17 at 8:45.  Risks, benefits, and pros and cons of  procedure were discussed, including infection, bleeding, trauma,  perforation during the ablation.  Patient fully aware and accepts, and  she will continue to use barrier contraception and/or the NuvaRing, as  well, until her partner has a vasectomy.   PLAN:  Patient is scheduled for endometrial ablation, NovaSure  technique, Thursday, April 17 at 8:45 a.m. at [facility].      Juan H. Lily Peer, M.D.  Electronically Signed  JHF/MEDQ  D:  08/25/2006  T:  08/25/2006  Job:  [id number]

## 2010-09-26 NOTE — Discharge Summary (Signed)
[facility]  Patient:    Linda [name], Linda [name]                MRN: [id number] Adm. Date:  [id number] Disc. Date: [id number] Attending:  Malon Kindle                           Discharge Summary  HISTORY OF PRESENT ILLNESS:   This is a 40 year old white female, G2, P1-0-1. Last period August 09, 1999, [facility] May 16, 2000 by dates and May 18, 2000 by ultrasound admitted at 8 cm dilatation in active labor.  Blood group and type was A negative with a negative antibody.  RPR nonreactive, rubella positive, hepatitis B surface antigen and HIV negative.  Triple screen normal. Group B strep negative.  One hour glucal 118.  Ultrasound at 11 weeks and 2 days, EDC at May 18, 2000.  Follow-up ultrasound on December 15, 1999, average gestational age [redacted] weeks and 5 days, EDC. May 19, 2000.  The patient received RhoGAM February 24, 2000.  She had an uncomplicated prenatal course. At 3:00 a.m. on the day of admission, she awoke with contractions.  After three or four contractions, she came to maternity admissions and was found to be 8 cm.  I was called at 4:02 a.m.  When I arrived at 4:13 a.m. she was fully dilated.  PAST MEDICAL HISTORY:         Cystitis.  ALLERGIES:                    CODEINE.  OPERATIONS:                   Nasal surgery.  FAMILY HISTORY:               Sister bipolar disorder.  HOSPITAL COURSE:              On admission, vital signs were normal.  Abdomen had been 37 cm fundal height on April 28, 2000, cervix was 10 cm vertex at a +2 station.  The patient pushed well and there was some fetal bradycardia. A midline episiotomy was made under local block and a living female infant was delivered OA by Dr. Ambrose Mantle, weight 7 pounds 11 ounces, Apgars 9 at 1 and 5 minutes.  Placenta was intact.  The uterus was normal.  Midline episiotomy repair with 3-0 Dexon.  Blood loss about 400 ccs.  Laceration superior to the urethra was sutured.   Catheterization showed that it was not close to the urethra in terms of placing sutures to obstruct the urethral lumen.  Post partum, the patient did well and she requested discharge on the first post partum day. As stated, the patient requested discharge but she will not be discharged before the status of her RhoGAM is known.  LABORATORY DATA:              Initial hemoglobin 13.9, hematocrit 38.2, white count 13,400, platelet count 188,000.  RPR is pending.  Follow-up hematocrit was 30.  FINAL CONDITION:              Improved.  INSTRUCTIONS:                 Regular discharge instruction booklet.  She is given a prescription for Darvocet N100 16 tablets 1 q. 4-6h as needed for pain, and asked to return in six weeks for follow-up examination. DD:  05/07/00 TD:  05/07/00  Job: [id number]

## 2010-11-17 ENCOUNTER — Other Ambulatory Visit: Payer: Self-pay | Admitting: Family Medicine

## 2010-11-17 NOTE — Telephone Encounter (Signed)
Pt last here on 07/15/10 and last filled on 07/15/10.

## 2010-11-18 NOTE — Telephone Encounter (Signed)
Ok to refill x 1  Further refill per Dr Clent Ridges

## 2010-12-04 ENCOUNTER — Other Ambulatory Visit: Payer: Self-pay | Admitting: Internal Medicine

## 2010-12-09 NOTE — Telephone Encounter (Signed)
Call in #30 with 5 rf 

## 2010-12-09 NOTE — Telephone Encounter (Signed)
duplicate

## 2010-12-10 NOTE — Telephone Encounter (Signed)
duplicate

## 2010-12-10 NOTE — Telephone Encounter (Signed)
done

## 2011-05-12 HISTORY — PX: BUNIONECTOMY: SHX129

## 2011-06-17 ENCOUNTER — Ambulatory Visit: Payer: 59 | Admitting: Family Medicine

## 2011-06-17 ENCOUNTER — Other Ambulatory Visit: Payer: Self-pay | Admitting: Internal Medicine

## 2011-06-19 NOTE — Telephone Encounter (Signed)
Call in #30 with 5 rf 

## 2011-07-30 ENCOUNTER — Ambulatory Visit (INDEPENDENT_AMBULATORY_CARE_PROVIDER_SITE_OTHER): Payer: 59 | Admitting: Family Medicine

## 2011-07-30 ENCOUNTER — Encounter: Payer: Self-pay | Admitting: Family Medicine

## 2011-07-30 VITALS — BP 102/64 | HR 79 | Temp 98.3°F | Wt 112.0 lb

## 2011-07-30 DIAGNOSIS — Z0000 Encounter for general adult medical examination without abnormal findings: Secondary | ICD-10-CM

## 2011-07-30 MED ORDER — SUMATRIPTAN SUCCINATE 100 MG PO TABS: 100.0000 mg | ORAL_TABLET | ORAL | Status: DC | PRN

## 2011-07-30 MED ORDER — ESCITALOPRAM OXALATE 20 MG PO TABS: 20.0000 mg | ORAL_TABLET | Freq: Every day | ORAL | Status: DC

## 2011-07-30 MED ORDER — TEMAZEPAM 30 MG PO CAPS: 30.0000 mg | ORAL_CAPSULE | Freq: Every evening | ORAL | Status: DC | PRN

## 2011-07-30 NOTE — Progress Notes (Signed)
  Subjective:    Patient ID: Linda [name], female    DOB: 15-Oct-1970, 41 y.o.   MRN: [id number]  HPI Here for follow up. She feels great and has no concerns. Her anxiety is stable and she sleeps well. Her migraines have become much less frequent. She will be marrying again in 3 weeks and moving into a new house. She is very excited about all this.    Review of Systems  Constitutional: Negative.   Respiratory: Negative.   Cardiovascular: Negative.   Psychiatric/Behavioral: Negative.        Objective:   Physical Exam  Constitutional: She appears well-developed and well-nourished.  Neck: No thyromegaly present.  Cardiovascular: Normal rate, regular rhythm, normal heart sounds and intact distal pulses.   Pulmonary/Chest: Effort normal and breath sounds normal.  Lymphadenopathy:    She has no cervical adenopathy.          Assessment & Plan:  Refilled her meds. Set up fasting labs soon

## 2011-08-03 ENCOUNTER — Other Ambulatory Visit (INDEPENDENT_AMBULATORY_CARE_PROVIDER_SITE_OTHER): Payer: 59

## 2011-08-03 DIAGNOSIS — Z0000 Encounter for general adult medical examination without abnormal findings: Secondary | ICD-10-CM

## 2011-08-03 LAB — TSH: TSH: 0.56 u[IU]/mL (ref 0.35–5.50)

## 2011-08-03 LAB — HEPATIC FUNCTION PANEL
Albumin: 4.1 g/dL (ref 3.5–5.2)
Alkaline Phosphatase: 35 U/L — ABNORMAL LOW (ref 39–117)
Total Protein: 6.9 g/dL (ref 6.0–8.3)

## 2011-08-03 LAB — CBC WITH DIFFERENTIAL/PLATELET
Basophils Relative: 0.3 % (ref 0.0–3.0)
Eosinophils Absolute: 0.1 10*3/uL (ref 0.0–0.7)
Eosinophils Relative: 2 % (ref 0.0–5.0)
Hemoglobin: 13.6 g/dL (ref 12.0–15.0)
Lymphocytes Relative: 27.2 % (ref 12.0–46.0)
Monocytes Relative: 4.8 % (ref 3.0–12.0)
Neutro Abs: 3.6 10*3/uL (ref 1.4–7.7)
Neutrophils Relative %: 65.7 % (ref 43.0–77.0)
RBC: 4.44 Mil/uL (ref 3.87–5.11)
WBC: 5.5 10*3/uL (ref 4.5–10.5)

## 2011-08-03 LAB — BASIC METABOLIC PANEL
CO2: 26 mEq/L (ref 19–32)
Calcium: 9.1 mg/dL (ref 8.4–10.5)
Creatinine, Ser: 0.7 mg/dL (ref 0.4–1.2)
GFR: 100.55 mL/min (ref >60.00)
Glucose, Bld: 82 mg/dL (ref 70–99)
Sodium: 138 mEq/L (ref 135–145)

## 2011-08-03 LAB — LIPID PANEL
HDL: 57 mg/dL (ref >39.00)
Triglycerides: 43 mg/dL (ref 0.0–149.0)
VLDL: 8.6 mg/dL (ref 0.0–40.0)

## 2011-08-04 NOTE — Progress Notes (Signed)
Quick Note:  Left voice message ______ 

## 2012-02-14 ENCOUNTER — Other Ambulatory Visit: Payer: Self-pay | Admitting: Family Medicine

## 2012-02-15 NOTE — Telephone Encounter (Signed)
Call in #30 with no rf. She needs an OV soon  

## 2012-03-10 ENCOUNTER — Encounter: Payer: Self-pay | Admitting: Family Medicine

## 2012-03-10 ENCOUNTER — Ambulatory Visit (INDEPENDENT_AMBULATORY_CARE_PROVIDER_SITE_OTHER): Payer: PRIVATE HEALTH INSURANCE | Admitting: Family Medicine

## 2012-03-10 VITALS — BP 102/60 | HR 91 | Temp 98.3°F | Wt 114.0 lb

## 2012-03-10 DIAGNOSIS — F411 Generalized anxiety disorder: Secondary | ICD-10-CM

## 2012-03-10 DIAGNOSIS — G4700 Insomnia, unspecified: Secondary | ICD-10-CM

## 2012-03-10 DIAGNOSIS — Z23 Encounter for immunization: Secondary | ICD-10-CM

## 2012-03-10 DIAGNOSIS — F329 Major depressive disorder, single episode, unspecified: Secondary | ICD-10-CM

## 2012-03-10 DIAGNOSIS — R51 Headache: Secondary | ICD-10-CM

## 2012-03-10 MED ORDER — TEMAZEPAM 15 MG PO CAPS: 15.0000 mg | ORAL_CAPSULE | Freq: Every evening | ORAL | Status: DC | PRN

## 2012-03-10 NOTE — Progress Notes (Signed)
  Subjective:    Patient ID: Linda [name], female    DOB: August 28, 1970, 41 y.o.   MRN: [id number]  HPI Here to follow up several things. Her migraines are much improved since her hysterectomy, and she no longer uses any Imitrex. She wants to get off sleeping meds but finds that she cannot sleep at all if she skips the Temazepam. Her moods are excellent.    Review of Systems  Constitutional: Negative.   Neurological: Negative.   Psychiatric/Behavioral: Positive for disturbed wake/sleep cycle. Negative for dysphoric mood and decreased concentration. The patient is not nervous/anxious.        Objective:   Physical Exam  Constitutional: She is oriented to person, place, and time. She appears well-developed and well-nourished.  Neurological: She is alert and oriented to person, place, and time.  Psychiatric: She has a normal mood and affect. Her behavior is normal. Thought content normal.          Assessment & Plan:  Her HAs are almost gone. Stay on Lexapro. We will decease the Temazepam to 15 mg qhs and try to wean  her off this.

## 2012-03-10 NOTE — Addendum Note (Signed)
Addended by: Aniceto Boss A on: 03/10/2012 10:46 AM   Modules accepted: Orders

## 2012-05-16 ENCOUNTER — Other Ambulatory Visit: Payer: Self-pay | Admitting: Family Medicine

## 2012-05-16 NOTE — Telephone Encounter (Signed)
Pt would like to go back to previous script for temazepam (RESTORIL) 30 MG capsule. Pt said she spoke w. MD and he said if the lower dose did not work out she could go back. Pt has not slept good in 2 months.  Pharm: [facility]/ [address]

## 2012-05-16 NOTE — Telephone Encounter (Signed)
Change to Temazepam 30 mg qhs, call in #30 with 5rf

## 2012-05-18 MED ORDER — TEMAZEPAM 30 MG PO CAPS: 30.0000 mg | ORAL_CAPSULE | Freq: Every evening | ORAL | Status: DC | PRN

## 2012-05-18 NOTE — Telephone Encounter (Signed)
I called in script and spoke with pt. 

## 2012-07-14 ENCOUNTER — Other Ambulatory Visit: Payer: Self-pay

## 2012-07-14 DIAGNOSIS — Z1231 Encounter for screening mammogram for malignant neoplasm of breast: Secondary | ICD-10-CM

## 2012-08-16 ENCOUNTER — Ambulatory Visit
Admission: RE | Admit: 2012-08-16 | Discharge: 2012-08-16 | Disposition: A | Discharge status: Home / Self Care | Payer: PRIVATE HEALTH INSURANCE | Source: Ambulatory Visit

## 2012-08-16 DIAGNOSIS — Z1231 Encounter for screening mammogram for malignant neoplasm of breast: Secondary | ICD-10-CM

## 2012-08-29 ENCOUNTER — Telehealth: Payer: Self-pay | Admitting: Family Medicine

## 2012-08-29 NOTE — Telephone Encounter (Signed)
Pt gave pharm RX for escitalopram (LEXAPRO) 20 MG tablet to "hold" until she needed, now they say they do not have. Could you please call this in?  Pt would like this called into [facility]. (not [facility]) Pls let pt know when this is done!

## 2012-08-31 MED ORDER — ESCITALOPRAM OXALATE 20 MG PO TABS: 20.0000 mg | ORAL_TABLET | Freq: Every day | ORAL | Status: DC

## 2012-08-31 NOTE — Telephone Encounter (Signed)
Call in Lexapro #30 to cover her until she comes in for her cpx next week

## 2012-08-31 NOTE — Telephone Encounter (Signed)
I sent script e-scribe and spoke with pt. 

## 2012-08-31 NOTE — Telephone Encounter (Signed)
Pt does not have any refills left on Lexapro.

## 2012-09-05 ENCOUNTER — Ambulatory Visit (INDEPENDENT_AMBULATORY_CARE_PROVIDER_SITE_OTHER): Payer: PRIVATE HEALTH INSURANCE | Admitting: Family Medicine

## 2012-09-05 ENCOUNTER — Encounter: Payer: Self-pay | Admitting: Family Medicine

## 2012-09-05 VITALS — BP 102/64 | HR 74 | Temp 98.1°F | Ht 65.5 in | Wt 114.0 lb

## 2012-09-05 DIAGNOSIS — Z0000 Encounter for general adult medical examination without abnormal findings: Secondary | ICD-10-CM

## 2012-09-05 DIAGNOSIS — Z01818 Encounter for other preprocedural examination: Secondary | ICD-10-CM

## 2012-09-05 LAB — HEPATIC FUNCTION PANEL
Albumin: 4.4 g/dL (ref 3.5–5.2)
Bilirubin, Direct: 0.1 mg/dL (ref 0.0–0.3)
Total Protein: 7.2 g/dL (ref 6.0–8.3)

## 2012-09-05 LAB — BASIC METABOLIC PANEL
CO2: 27 mEq/L (ref 19–32)
Calcium: 9.4 mg/dL (ref 8.4–10.5)
Creatinine, Ser: 0.7 mg/dL (ref 0.4–1.2)
GFR: 98.37 mL/min (ref >60.00)
Sodium: 137 mEq/L (ref 135–145)

## 2012-09-05 LAB — APTT: aPTT: 29.1 s — ABNORMAL HIGH (ref 21.7–28.8)

## 2012-09-05 LAB — POCT URINALYSIS DIPSTICK
Bilirubin, UA: NEGATIVE
Glucose, UA: NEGATIVE
Nitrite, UA: NEGATIVE
Spec Grav, UA: 1.02
Urobilinogen, UA: 0.2

## 2012-09-05 LAB — CBC WITH DIFFERENTIAL/PLATELET
Eosinophils Relative: 1.9 % (ref 0.0–5.0)
Monocytes Relative: 6.5 % (ref 3.0–12.0)
Neutrophils Relative %: 55.6 % (ref 43.0–77.0)
Platelets: 187 10*3/uL (ref 150.0–400.0)
RBC: 4.53 Mil/uL (ref 3.87–5.11)
WBC: 4.3 10*3/uL — ABNORMAL LOW (ref 4.5–10.5)

## 2012-09-05 LAB — LIPID PANEL
HDL: 55.5 mg/dL (ref >39.00)
LDL Cholesterol: 100 mg/dL — ABNORMAL HIGH (ref 0–99)
Total CHOL/HDL Ratio: 3
Triglycerides: 44 mg/dL (ref 0.0–149.0)
VLDL: 8.8 mg/dL (ref 0.0–40.0)

## 2012-09-05 LAB — PROTIME-INR
INR: 1.1 ratio — ABNORMAL HIGH (ref 0.8–1.0)
Prothrombin Time: 11.4 s (ref 10.2–12.4)

## 2012-09-05 LAB — TSH: TSH: 0.45 u[IU]/mL (ref 0.35–5.50)

## 2012-09-05 MED ORDER — OMEPRAZOLE 40 MG PO CPDR: 40.0000 mg | DELAYED_RELEASE_CAPSULE | Freq: Every day | ORAL | Status: DC

## 2012-09-05 MED ORDER — TEMAZEPAM 15 MG PO CAPS: 15.0000 mg | ORAL_CAPSULE | Freq: Every evening | ORAL | Status: DC | PRN

## 2012-09-05 MED ORDER — ESCITALOPRAM OXALATE 20 MG PO TABS: 20.0000 mg | ORAL_TABLET | Freq: Every day | ORAL | Status: DC

## 2012-09-05 NOTE — Progress Notes (Signed)
  Subjective:    Patient ID: Linda [name], female    DOB: 1970-10-05, 42 y.o.   MRN: [id number]  HPI 42 yr old female for a cpx. She feels well in general but asks about acid reflux. She has had frequent heartburn lately and has been using some OTC Prilosec with good results. Her anxiety is stable and she is sleeping well. 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 She is very excited about this and happy about her job change. She plans to have some nasal surgery soon per Dr. Kandice Moos in [place] to repair a deviated septum and for some other changes. She needs a clearance form filled out.    Review of Systems  Constitutional: Negative.   HENT: Negative.   Eyes: Negative.   Respiratory: Negative.   Cardiovascular: Negative.   Gastrointestinal: Negative.   Genitourinary: Negative for dysuria, urgency, frequency, hematuria, flank pain, decreased urine volume, enuresis, difficulty urinating, pelvic pain and dyspareunia.  Musculoskeletal: Negative.   Skin: Negative.   Neurological: Negative.   Psychiatric/Behavioral: Negative.        Objective:   Physical Exam  Constitutional: She is oriented to person, place, and time. She appears well-developed and well-nourished. No distress.  HENT:  Head: Normocephalic and atraumatic.  Right Ear: External ear normal.  Left Ear: External ear normal.  Nose: Nose normal.  Mouth/Throat: Oropharynx is clear and moist. No oropharyngeal exudate.  Eyes: Conjunctivae and EOM are normal. Pupils are equal, round, and reactive to light. No scleral icterus.  Neck: Normal range of motion. Neck supple. No JVD present. No thyromegaly present.  Cardiovascular: Normal rate, regular rhythm, normal heart sounds and intact distal pulses.  Exam reveals no gallop and no friction rub.   No murmur heard. Pulmonary/Chest: Effort normal and breath sounds normal. No respiratory distress. She has no wheezes. She has no rales. She  exhibits no tenderness.  Abdominal: Soft. Bowel sounds are normal. She exhibits no distension and no mass. There is no tenderness. There is no rebound and no guarding.  Musculoskeletal: Normal range of motion. She exhibits no edema and no tenderness.  Lymphadenopathy:    She has no cervical adenopathy.  Neurological: She is alert and oriented to person, place, and time. She has normal reflexes. No cranial nerve deficit. She exhibits normal muscle tone. Coordination normal.  Skin: Skin is warm and dry. No rash noted. No erythema.  Psychiatric: She has a normal mood and affect. Her behavior is normal. Judgment and thought content normal.          Assessment & Plan:  Well exam. She is cleared for surgery. Get fasting labs today. Start on Omeprazole daily.

## 2012-11-10 ENCOUNTER — Other Ambulatory Visit: Payer: Self-pay | Admitting: Family Medicine

## 2012-11-10 NOTE — Telephone Encounter (Signed)
Okay for 6 months 

## 2013-01-23 ENCOUNTER — Other Ambulatory Visit: Payer: Self-pay | Admitting: Dermatology

## 2013-03-13 ENCOUNTER — Ambulatory Visit (INDEPENDENT_AMBULATORY_CARE_PROVIDER_SITE_OTHER): Payer: PRIVATE HEALTH INSURANCE | Admitting: Family Medicine

## 2013-03-13 DIAGNOSIS — Z23 Encounter for immunization: Secondary | ICD-10-CM

## 2013-04-27 ENCOUNTER — Telehealth: Payer: Self-pay | Admitting: Family Medicine

## 2013-04-27 ENCOUNTER — Other Ambulatory Visit: Payer: Self-pay | Admitting: Family Medicine

## 2013-04-27 NOTE — Telephone Encounter (Signed)
Error, refill request for temazepam (RESTORIL) 30 MG capsule already in system.

## 2013-04-28 NOTE — Telephone Encounter (Signed)
Call in #30 with 5 rf 

## 2013-05-17 ENCOUNTER — Encounter: Payer: Self-pay | Admitting: *Deleted

## 2013-05-18 ENCOUNTER — Ambulatory Visit (INDEPENDENT_AMBULATORY_CARE_PROVIDER_SITE_OTHER): Payer: PRIVATE HEALTH INSURANCE | Admitting: Family Medicine

## 2013-05-18 ENCOUNTER — Encounter: Payer: Self-pay | Admitting: Family Medicine

## 2013-05-18 VITALS — BP 100/60 | HR 78 | Temp 98.2°F | Wt 116.0 lb

## 2013-05-18 DIAGNOSIS — F411 Generalized anxiety disorder: Secondary | ICD-10-CM

## 2013-05-18 DIAGNOSIS — G4700 Insomnia, unspecified: Secondary | ICD-10-CM

## 2013-05-18 NOTE — Progress Notes (Signed)
   Subjective:    Patient ID: Linda [name], female    DOB: June 28, 1970, 43 y.o.   MRN: [id number]  HPI Here to follow up. She is doing well.    Review of Systems  Constitutional: Negative.   Psychiatric/Behavioral: Negative.        Objective:   Physical Exam  Constitutional: She is oriented to person, place, and time. She appears well-developed and well-nourished.  Neurological: She is alert and oriented to person, place, and time.  Psychiatric: She has a normal mood and affect. Her behavior is normal. Thought content normal.          Assessment & Plan:  Doing well.

## 2013-06-14 ENCOUNTER — Encounter: Payer: Self-pay | Admitting: Family Medicine

## 2013-09-21 ENCOUNTER — Other Ambulatory Visit: Payer: Self-pay | Admitting: Family Medicine

## 2013-09-25 ENCOUNTER — Other Ambulatory Visit: Payer: Self-pay

## 2013-09-25 DIAGNOSIS — Z1231 Encounter for screening mammogram for malignant neoplasm of breast: Secondary | ICD-10-CM

## 2013-10-06 ENCOUNTER — Encounter (INDEPENDENT_AMBULATORY_CARE_PROVIDER_SITE_OTHER): Payer: Self-pay

## 2013-10-06 ENCOUNTER — Ambulatory Visit
Admission: RE | Admit: 2013-10-06 | Discharge: 2013-10-06 | Disposition: A | Discharge status: Home / Self Care | Payer: PRIVATE HEALTH INSURANCE | Source: Ambulatory Visit

## 2013-10-06 DIAGNOSIS — Z1231 Encounter for screening mammogram for malignant neoplasm of breast: Secondary | ICD-10-CM

## 2013-10-31 ENCOUNTER — Other Ambulatory Visit: Payer: Self-pay | Admitting: Family Medicine

## 2013-10-31 NOTE — Telephone Encounter (Signed)
pcp NA  Ok to refill  30 days further refills  Per pcp. Dr Clent RidgesFry

## 2013-12-07 ENCOUNTER — Other Ambulatory Visit: Payer: Self-pay | Admitting: Internal Medicine

## 2013-12-07 NOTE — Telephone Encounter (Signed)
Call in #30 with 5 rf 

## 2013-12-07 NOTE — Telephone Encounter (Signed)
Okay to refill Temazepam? 

## 2014-01-23 ENCOUNTER — Ambulatory Visit (INDEPENDENT_AMBULATORY_CARE_PROVIDER_SITE_OTHER): Payer: PRIVATE HEALTH INSURANCE | Admitting: Family Medicine

## 2014-01-23 ENCOUNTER — Encounter: Payer: Self-pay | Admitting: Family Medicine

## 2014-01-23 VITALS — BP 103/67 | HR 66 | Temp 98.6°F | Ht 65.5 in | Wt 111.0 lb

## 2014-01-23 DIAGNOSIS — R319 Hematuria, unspecified: Secondary | ICD-10-CM

## 2014-01-23 DIAGNOSIS — N390 Urinary tract infection, site not specified: Secondary | ICD-10-CM

## 2014-01-23 LAB — POCT URINALYSIS DIPSTICK
BILIRUBIN UA: NEGATIVE
Glucose, UA: NEGATIVE
KETONES UA: NEGATIVE
Nitrite, UA: NEGATIVE
PH UA: 7.5
SPEC GRAV UA: 1.01
Urobilinogen, UA: 0.2

## 2014-01-23 MED ORDER — FLUCONAZOLE 150 MG PO TABS: 150.0000 mg | ORAL_TABLET | Freq: Once | ORAL | Status: DC

## 2014-01-23 MED ORDER — CIPROFLOXACIN HCL 500 MG PO TABS: 500.0000 mg | ORAL_TABLET | Freq: Two times a day (BID) | ORAL | Status: DC

## 2014-01-23 NOTE — Progress Notes (Signed)
Pre visit review using our clinic review tool, if applicable. No additional management support is needed unless otherwise documented below in the visit note. 

## 2014-01-23 NOTE — Progress Notes (Signed)
   Subjective:    Patient ID: Linda [name], female    DOB: 1971/03/25, 43 y.o.   MRN: [id number]  HPI Here for 24 hours of urinary burning and frequency, sweats, and blood in the urine. No vomiting.    Review of Systems  Constitutional: Positive for chills and diaphoresis. Negative for fever.  Respiratory: Negative.   Gastrointestinal: Negative.   Genitourinary: Positive for dysuria, urgency, frequency and hematuria. Negative for flank pain and pelvic pain.       Objective:   Physical Exam  Constitutional: She appears well-developed and well-nourished.  Abdominal: Soft. Bowel sounds are normal. She exhibits no distension. There is no tenderness. There is no rebound and no guarding.          Assessment & Plan:  Treat with Cipro and fluids. Add Azo prn. Culture the sample.

## 2014-01-26 LAB — URINE CULTURE: Colony Count: >=100000

## 2014-02-08 ENCOUNTER — Telehealth: Payer: Self-pay | Admitting: Family Medicine

## 2014-02-08 NOTE — Telephone Encounter (Signed)
Call in Macrobid 100 mg bid for 7 days  

## 2014-02-08 NOTE — Telephone Encounter (Signed)
Pt states her UTI from 9/15 is not completely gone. Would like to know if she should have another abx. Pt has some irritation,discomfort, no more bleeding. [facility]

## 2014-02-09 MED ORDER — NITROFURANTOIN MONOHYD MACRO 100 MG PO CAPS: 100.0000 mg | ORAL_CAPSULE | Freq: Two times a day (BID) | ORAL | Status: DC

## 2014-02-09 NOTE — Telephone Encounter (Signed)
I sent script e-scribe and spoke with pt. 

## 2014-06-04 ENCOUNTER — Other Ambulatory Visit: Payer: Self-pay | Admitting: Family Medicine

## 2014-06-04 NOTE — Telephone Encounter (Signed)
Refill for 6 months. 

## 2014-09-27 ENCOUNTER — Telehealth: Payer: Self-pay | Admitting: Family Medicine

## 2014-09-27 NOTE — Telephone Encounter (Signed)
Pt request refill of the following: LEXAPRO 20 MG tablet   [facility]

## 2014-09-28 NOTE — Telephone Encounter (Signed)
Refill for one year 

## 2014-10-01 ENCOUNTER — Ambulatory Visit: Payer: PRIVATE HEALTH INSURANCE | Admitting: Family Medicine

## 2014-10-01 MED ORDER — LEXAPRO 20 MG PO TABS: 20.0000 mg | ORAL_TABLET | Freq: Every day | ORAL | Status: DC

## 2014-10-01 NOTE — Telephone Encounter (Signed)
I sent script e-scribe. 

## 2014-10-01 NOTE — Telephone Encounter (Signed)
Patient is calling to f/u on the below re-fill request she would like it sent to the below pharmacy.  [facility] [address]

## 2014-10-03 ENCOUNTER — Other Ambulatory Visit: Payer: Self-pay

## 2014-10-03 ENCOUNTER — Telehealth: Payer: Self-pay | Admitting: Family Medicine

## 2014-10-03 DIAGNOSIS — Z1231 Encounter for screening mammogram for malignant neoplasm of breast: Secondary | ICD-10-CM

## 2014-10-03 NOTE — Telephone Encounter (Signed)
Pt can not afford name brand . Pt would like generic lexapro call into [facility]

## 2014-10-03 NOTE — Telephone Encounter (Signed)
Per Dr. Clent RidgesFry okay to change to generic.

## 2014-10-04 MED ORDER — ESCITALOPRAM OXALATE 20 MG PO TABS: 20.0000 mg | ORAL_TABLET | Freq: Every day | ORAL | Status: DC

## 2014-10-04 NOTE — Telephone Encounter (Signed)
Rx resent to pharmacy for generic.  Pt aware. Pt states she was on the generic previously and felt like it was not working.  Pt has been paying for the Brand name and the cost has gone up to $200 dollars.  Advised pt to give the office a call back if prescription is not working and we would go from there.  Pt verbalized understanding.

## 2014-11-08 ENCOUNTER — Ambulatory Visit
Admission: RE | Admit: 2014-11-08 | Discharge: 2014-11-08 | Disposition: A | Discharge status: Home / Self Care | Payer: PRIVATE HEALTH INSURANCE | Source: Ambulatory Visit

## 2014-11-08 DIAGNOSIS — Z1231 Encounter for screening mammogram for malignant neoplasm of breast: Secondary | ICD-10-CM

## 2014-11-13 ENCOUNTER — Other Ambulatory Visit: Payer: Self-pay | Admitting: Family Medicine

## 2014-11-13 DIAGNOSIS — R928 Other abnormal and inconclusive findings on diagnostic imaging of breast: Secondary | ICD-10-CM

## 2014-11-15 ENCOUNTER — Ambulatory Visit
Admission: RE | Admit: 2014-11-15 | Discharge: 2014-11-15 | Disposition: A | Discharge status: Home / Self Care | Payer: PRIVATE HEALTH INSURANCE | Source: Ambulatory Visit | Attending: Family Medicine | Admitting: Family Medicine

## 2014-11-15 DIAGNOSIS — R928 Other abnormal and inconclusive findings on diagnostic imaging of breast: Secondary | ICD-10-CM

## 2014-11-25 ENCOUNTER — Other Ambulatory Visit: Payer: Self-pay | Admitting: Family Medicine

## 2014-11-26 NOTE — Telephone Encounter (Signed)
Call in #30 with 5 rf 

## 2014-12-20 ENCOUNTER — Telehealth: Payer: Self-pay | Admitting: Family Medicine

## 2014-12-20 NOTE — Telephone Encounter (Signed)
Pt would like dr fry to accept her 46 yrs old son as new pt. Can I sch?

## 2014-12-24 NOTE — Telephone Encounter (Signed)
Yes I can see him, thanks. Please schedule him

## 2014-12-25 NOTE — Telephone Encounter (Signed)
Pt son has  Been sch

## 2015-05-29 ENCOUNTER — Other Ambulatory Visit: Payer: Self-pay | Admitting: Family Medicine

## 2015-05-29 NOTE — Telephone Encounter (Signed)
Call in #30 with no rf. She needs an OV soon  

## 2015-06-12 ENCOUNTER — Encounter: Payer: Self-pay | Admitting: Family Medicine

## 2015-06-12 ENCOUNTER — Ambulatory Visit (INDEPENDENT_AMBULATORY_CARE_PROVIDER_SITE_OTHER): Payer: PRIVATE HEALTH INSURANCE | Admitting: Family Medicine

## 2015-06-12 VITALS — BP 96/69 | HR 70 | Temp 98.4°F | Ht 65.5 in | Wt 114.0 lb

## 2015-06-12 DIAGNOSIS — Z23 Encounter for immunization: Secondary | ICD-10-CM | POA: Diagnosis not present

## 2015-06-12 DIAGNOSIS — F411 Generalized anxiety disorder: Secondary | ICD-10-CM | POA: Diagnosis not present

## 2015-06-12 DIAGNOSIS — G4700 Insomnia, unspecified: Secondary | ICD-10-CM

## 2015-06-12 MED ORDER — CITALOPRAM HYDROBROMIDE 20 MG PO TABS: 20.0000 mg | ORAL_TABLET | Freq: Every day | ORAL | Status: DC

## 2015-06-12 MED ORDER — TEMAZEPAM 30 MG PO CAPS: 30.0000 mg | ORAL_CAPSULE | Freq: Every day | ORAL | Status: DC

## 2015-06-12 NOTE — Progress Notes (Signed)
   Subjective:    Patient ID: Linda [name], female    DOB: 31-Aug-1970, 45 y.o.   MRN: [id number]  HPI Here to follow up on medications. She is happy with Temazepam and she sleeps well. She is not so happy with Lexapro. She had done well on name brand Lexapro and the generic version is not as effective. She denies any depression symptoms but she does feel anxious frequently.    Review of Systems  Constitutional: Negative.   Respiratory: Negative.   Cardiovascular: Negative.   Neurological: Negative.   Psychiatric/Behavioral: Negative for confusion, dysphoric mood, decreased concentration and agitation. The patient is nervous/anxious.        Objective:   Physical Exam  Constitutional: She is oriented to person, place, and time. She appears well-developed and well-nourished.  Cardiovascular: Normal rate, regular rhythm, normal heart sounds and intact distal pulses.   Pulmonary/Chest: Effort normal and breath sounds normal.  Neurological: She is alert and oriented to person, place, and time.  Psychiatric: She has a normal mood and affect. Her behavior is normal. Thought content normal.          Assessment & Plan:  Her insomnia is stable so we refilled the Temazepam. For the anxiety, we will switch to Celexa 20 mg daily. She will follow up with a cpx in the next few weeks

## 2015-06-12 NOTE — Progress Notes (Signed)
Pre visit review using our clinic review tool, if applicable. No additional management support is needed unless otherwise documented below in the visit note. 

## 2015-06-21 ENCOUNTER — Encounter: Payer: PRIVATE HEALTH INSURANCE | Admitting: Family Medicine

## 2015-06-24 ENCOUNTER — Telehealth: Payer: Self-pay | Admitting: Family Medicine

## 2015-06-24 NOTE — Telephone Encounter (Signed)
Pt last picked up script on Jan. 19,02017. Per Dr. Clent Ridges okay to refill early due to travel.

## 2015-06-24 NOTE — Telephone Encounter (Signed)
Pharmacy call to say pt wanted to know if she could get a early refill on the following med because she is going out of town.   temazepam (RESTORIL) 30 MG capsule

## 2015-07-09 ENCOUNTER — Other Ambulatory Visit (INDEPENDENT_AMBULATORY_CARE_PROVIDER_SITE_OTHER): Payer: PRIVATE HEALTH INSURANCE

## 2015-07-09 DIAGNOSIS — Z0000 Encounter for general adult medical examination without abnormal findings: Secondary | ICD-10-CM

## 2015-07-09 LAB — BASIC METABOLIC PANEL
BUN: 9 mg/dL (ref 6–23)
CHLORIDE: 104 meq/L (ref 96–112)
CO2: 30 mEq/L (ref 19–32)
Calcium: 9.2 mg/dL (ref 8.4–10.5)
Creatinine, Ser: 0.61 mg/dL (ref 0.40–1.20)
GFR: 111.96 mL/min (ref >60.00)
GLUCOSE: 89 mg/dL (ref 70–99)
POTASSIUM: 4.5 meq/L (ref 3.5–5.1)
SODIUM: 138 meq/L (ref 135–145)

## 2015-07-09 LAB — CBC WITH DIFFERENTIAL/PLATELET
Basophils Absolute: 0 10*3/uL (ref 0.0–0.1)
Basophils Relative: 0.3 % (ref 0.0–3.0)
EOS PCT: 2.2 % (ref 0.0–5.0)
Eosinophils Absolute: 0.2 10*3/uL (ref 0.0–0.7)
HCT: 39.9 % (ref 36.0–46.0)
Hemoglobin: 13.6 g/dL (ref 12.0–15.0)
LYMPHS ABS: 1.5 10*3/uL (ref 0.7–4.0)
Lymphocytes Relative: 19.3 % (ref 12.0–46.0)
MCHC: 34.1 g/dL (ref 30.0–36.0)
MCV: 87.5 fl (ref 78.0–100.0)
MONOS PCT: 4.7 % (ref 3.0–12.0)
Monocytes Absolute: 0.4 10*3/uL (ref 0.1–1.0)
NEUTROS ABS: 5.7 10*3/uL (ref 1.4–7.7)
NEUTROS PCT: 73.5 % (ref 43.0–77.0)
PLATELETS: 183 10*3/uL (ref 150.0–400.0)
RBC: 4.56 Mil/uL (ref 3.87–5.11)
RDW: 13 % (ref 11.5–15.5)
WBC: 7.7 10*3/uL (ref 4.0–10.5)

## 2015-07-09 LAB — LIPID PANEL
CHOL/HDL RATIO: 3
CHOLESTEROL: 156 mg/dL (ref 0–200)
HDL: 55.9 mg/dL (ref >39.00)
LDL Cholesterol: 90 mg/dL (ref 0–99)
NonHDL: 99.73
TRIGLYCERIDES: 51 mg/dL (ref 0.0–149.0)
VLDL: 10.2 mg/dL (ref 0.0–40.0)

## 2015-07-09 LAB — POC URINALSYSI DIPSTICK (AUTOMATED)
BILIRUBIN UA: NEGATIVE
Glucose, UA: NEGATIVE
Leukocytes, UA: NEGATIVE
NITRITE UA: NEGATIVE
Protein, UA: NEGATIVE
RBC UA: NEGATIVE
Spec Grav, UA: 1.02
UROBILINOGEN UA: 0.2
pH, UA: 6.5

## 2015-07-09 LAB — TSH: TSH: 0.62 u[IU]/mL (ref 0.35–4.50)

## 2015-07-09 LAB — HEPATIC FUNCTION PANEL
ALBUMIN: 4.3 g/dL (ref 3.5–5.2)
ALT: 14 U/L (ref 0–35)
AST: 16 U/L (ref 0–37)
Alkaline Phosphatase: 36 U/L — ABNORMAL LOW (ref 39–117)
Bilirubin, Direct: 0.2 mg/dL (ref 0.0–0.3)
Total Bilirubin: 0.9 mg/dL (ref 0.2–1.2)
Total Protein: 6.6 g/dL (ref 6.0–8.3)

## 2015-07-17 ENCOUNTER — Encounter: Payer: PRIVATE HEALTH INSURANCE | Admitting: Family Medicine

## 2015-08-01 ENCOUNTER — Other Ambulatory Visit: Payer: Self-pay | Admitting: Family Medicine

## 2015-08-02 NOTE — Telephone Encounter (Signed)
We just refilled this on 06-12-15

## 2015-08-06 ENCOUNTER — Ambulatory Visit (INDEPENDENT_AMBULATORY_CARE_PROVIDER_SITE_OTHER): Payer: PRIVATE HEALTH INSURANCE | Admitting: Family Medicine

## 2015-08-06 ENCOUNTER — Encounter: Payer: Self-pay | Admitting: Family Medicine

## 2015-08-06 VITALS — BP 98/70 | HR 67 | Temp 98.6°F | Ht 65.5 in | Wt 108.0 lb

## 2015-08-06 DIAGNOSIS — Z0000 Encounter for general adult medical examination without abnormal findings: Secondary | ICD-10-CM | POA: Diagnosis not present

## 2015-08-06 NOTE — Progress Notes (Signed)
Pre visit review using our clinic review tool, if applicable. No additional management support is needed unless otherwise documented below in the visit note. 

## 2015-08-06 NOTE — Progress Notes (Signed)
   Subjective:    Patient ID: Linda [name], female    DOB: 1970/12/14, 45 y.o.   MRN: [id number]  HPI 45 yr old female for a cpx. She feels well and has no concerns.    Review of Systems  Constitutional: Negative.   HENT: Negative.   Eyes: Negative.   Respiratory: Negative.   Cardiovascular: Negative.   Gastrointestinal: Negative.   Genitourinary: Negative for dysuria, urgency, frequency, hematuria, flank pain, decreased urine volume, enuresis, difficulty urinating, pelvic pain and dyspareunia.  Musculoskeletal: Negative.   Skin: Negative.   Neurological: Negative.   Psychiatric/Behavioral: Negative.        Objective:   Physical Exam  Constitutional: She is oriented to person, place, and time. She appears well-developed and well-nourished. No distress.  HENT:  Head: Normocephalic and atraumatic.  Right Ear: External ear normal.  Left Ear: External ear normal.  Nose: Nose normal.  Mouth/Throat: Oropharynx is clear and moist. No oropharyngeal exudate.  Eyes: Conjunctivae and EOM are normal. Pupils are equal, round, and reactive to light. No scleral icterus.  Neck: Normal range of motion. Neck supple. No JVD present. No thyromegaly present.  Cardiovascular: Normal rate, regular rhythm, normal heart sounds and intact distal pulses.  Exam reveals no gallop and no friction rub.   No murmur heard. Pulmonary/Chest: Effort normal and breath sounds normal. No respiratory distress. She has no wheezes. She has no rales. She exhibits no tenderness.  Abdominal: Soft. Bowel sounds are normal. She exhibits no distension and no mass. There is no tenderness. There is no rebound and no guarding.  Musculoskeletal: Normal range of motion. She exhibits no edema or tenderness.  Lymphadenopathy:    She has no cervical adenopathy.  Neurological: She is alert and oriented to person, place, and time. She has normal reflexes. No cranial nerve deficit. She exhibits normal muscle tone. Coordination  normal.  Skin: Skin is warm and dry. No rash noted. No erythema.  Psychiatric: She has a normal mood and affect. Her behavior is normal. Judgment and thought content normal.          Assessment & Plan:  Well exam. We discussed diet and exercise.

## 2015-08-09 ENCOUNTER — Ambulatory Visit (INDEPENDENT_AMBULATORY_CARE_PROVIDER_SITE_OTHER): Payer: PRIVATE HEALTH INSURANCE | Admitting: Gynecology

## 2015-08-09 ENCOUNTER — Encounter: Payer: Self-pay | Admitting: Gynecology

## 2015-08-09 VITALS — BP 118/80 | Ht 65.0 in | Wt 108.0 lb

## 2015-08-09 DIAGNOSIS — N760 Acute vaginitis: Secondary | ICD-10-CM | POA: Diagnosis not present

## 2015-08-09 DIAGNOSIS — A499 Bacterial infection, unspecified: Secondary | ICD-10-CM

## 2015-08-09 DIAGNOSIS — B9689 Other specified bacterial agents as the cause of diseases classified elsewhere: Secondary | ICD-10-CM

## 2015-08-09 LAB — WET PREP FOR TRICH, YEAST, CLUE
TRICH WET PREP: NONE SEEN
YEAST WET PREP: NONE SEEN

## 2015-08-09 MED ORDER — METRONIDAZOLE 500 MG PO TABS: 500.0000 mg | ORAL_TABLET | Freq: Two times a day (BID) | ORAL | Status: DC

## 2015-08-09 MED ORDER — FLUCONAZOLE 150 MG PO TABS: 150.0000 mg | ORAL_TABLET | Freq: Once | ORAL | Status: DC

## 2015-08-09 NOTE — Addendum Note (Signed)
Addended by: Kem ParkinsonBARNES, Yang Rack on: 08/09/2015 11:34 AM   Modules accepted: Orders

## 2015-08-09 NOTE — Patient Instructions (Signed)
Take the Flagyl medication twice daily for 7 days. Avoid alcohol while taking. Take the Diflucan pills daily for 2 days. Follow up if symptoms persist, worsen or recur.

## 2015-08-09 NOTE — Progress Notes (Signed)
    Lurena NidaMichelle B Krempasky 1970-08-29 [id number]        45 y.o.  G0P0000 presents with history of vaginal discharge and itching. Treated herself with OTC antifungal. Symptoms recurred and she retreated herself again several weeks ago. Now has had on and off discharge with irritation. No urinary symptoms such as frequency dysuria or urgency.  Past medical history,surgical history, problem list, medications, allergies, family history and social history were all reviewed and documented in the EPIC chart.  Directed ROS with pertinent positives and negatives documented in the history of present illness/assessment and plan.  Exam: Bari MantisKim Alexis assistant Filed Vitals:   08/09/15 0906  BP: 118/80  Height: 5\' 5"  (1.651 m)  Weight: 108 lb (48.988 kg)   General appearance:  Normal Abdomen soft nontender without masses guarding rebound Pelvic external BUS vagina with thick white discharge. Bimanual without masses or tenderness.  Assessment/Plan:  45 y.o. G0P0000 with above history. Wet prep consistent with bacterial vaginosis. Will treat with Flagyl 500 mg twice a day 7 days, alcohol avoidance reviewed. I'm also going to cover her for possible yeast with Diflucan 150 mg daily 2 days. Follow up if symptoms persist, worsen or recur.    Dara LordsFONTAINE,Xian Apostol P MD, 9:34 AM 08/09/2015

## 2015-08-28 ENCOUNTER — Ambulatory Visit (INDEPENDENT_AMBULATORY_CARE_PROVIDER_SITE_OTHER): Payer: PRIVATE HEALTH INSURANCE | Admitting: Women's Health

## 2015-08-28 ENCOUNTER — Encounter: Payer: Self-pay | Admitting: Women's Health

## 2015-08-28 VITALS — BP 110/80 | Ht 65.0 in | Wt 111.0 lb

## 2015-08-28 DIAGNOSIS — B3731 Acute candidiasis of vulva and vagina: Secondary | ICD-10-CM

## 2015-08-28 DIAGNOSIS — Z90710 Acquired absence of both cervix and uterus: Secondary | ICD-10-CM | POA: Diagnosis not present

## 2015-08-28 DIAGNOSIS — B373 Candidiasis of vulva and vagina: Secondary | ICD-10-CM | POA: Diagnosis not present

## 2015-08-28 DIAGNOSIS — N898 Other specified noninflammatory disorders of vagina: Secondary | ICD-10-CM

## 2015-08-28 DIAGNOSIS — Z01419 Encounter for gynecological examination (general) (routine) without abnormal findings: Secondary | ICD-10-CM

## 2015-08-28 LAB — WET PREP FOR TRICH, YEAST, CLUE
Clue Cells Wet Prep HPF POC: NONE SEEN
Trich, Wet Prep: NONE SEEN
WBC WET PREP: NONE SEEN
YEAST WET PREP: NONE SEEN

## 2015-08-28 MED ORDER — FLUCONAZOLE 150 MG PO TABS: 150.0000 mg | ORAL_TABLET | Freq: Once | ORAL | Status: DC

## 2015-08-28 NOTE — Progress Notes (Signed)
Linda [name] 1971/02/12 [id number]    History:    Presents for annual exam.  2010 hysterectomy /benign.   Anxiety and depression meds managed by primary care. Normal Pap and mammogram history.  Past medical history, past surgical history, family history and social history were all reviewed and documented in the EPIC chart. 2 children ages 1218 and 6315, both doing well.  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  ROS:  A ROS was performed and pertinent positives and negatives are included.  Exam:  Filed Vitals:   08/28/15 0939  BP: 110/80    General appearance:  Normal Thyroid:  Symmetrical, normal in size, without palpable masses or nodularity. Respiratory  Auscultation:  Clear without wheezing or rhonchi Cardiovascular  Auscultation:  Regular rate, without rubs, murmurs or gallops  Edema/varicosities:  Not grossly evident Abdominal  Soft,nontender, without masses, guarding or rebound.  Liver/spleen:  No organomegaly noted  Hernia:  None appreciated  Skin  Inspection:  Grossly normal   Breasts: Examined lying and sitting/Implants.     Right: Without masses, retractions, discharge or axillary adenopathy.     Left: Without masses, retractions, discharge or axillary adenopathy. Gentitourinary   Inguinal/mons:  Normal without inguinal adenopathy  External genitalia:  Normal  BUS/Urethra/Skene's glands:  Normal  Vagina:  Normal  Cervix:  Absent Uterus:  Absent  Adnexa/parametria:     Rt: Without masses or tenderness.   Lt: Without masses or tenderness.  Anus and perineum: Normal  Digital rectal exam: Normal sphincter tone without palpated masses or tenderness  Assessment/Plan:  45 y.o. MWF G3 P2  for annual exam with complaint of occasional vaginal itching.  TVH Anxiety and depression Meds per primary care  Labs per primary   Plan: SBE's, annual  screening mammogram, 3-D tomography reviewed and encouraged. Exercise, calcium rich diet, vitamin D 1000 daily encouraged. Reviewed wet prep  negative, prescription for Diflucan 150 if vaginal itching reoccurs. UA. Gardasil encouraged for children.     Harrington ChallengerYOUNG,Sascha Baugher J [facility] LLCWHNP, 12:50 PM 08/28/2015

## 2015-08-28 NOTE — Patient Instructions (Signed)
Human Papillomavirus Quadrivalent Vaccine suspension for injection What is this medicine? HUMAN PAPILLOMAVIRUS VACCINE (HYOO muhn pap uh LOH muh vahy ruhs vak SEEN) is a vaccine. It is used to prevent infections of four types of the human papillomavirus. In women, the vaccine may lower your risk of getting cervical, vaginal, vulvar, or anal cancer and genital warts. In men, the vaccine may lower your risk of getting genital warts and anal cancer. You cannot get these diseases from the vaccine. This vaccine does not treat these diseases. This medicine may be used for other purposes; ask your health care provider or pharmacist if you have questions. What should I tell my health care provider before I take this medicine? They need to know if you have any of these conditions: -fever or infection -hemophilia -HIV infection or AIDS -immune system problems -low platelet count -an unusual reaction to Human Papillomavirus Vaccine, yeast, other medicines, foods, dyes, or preservatives -pregnant or trying to get pregnant -breast-feeding How should I use this medicine? This vaccine is for injection in a muscle on your upper arm or thigh. It is given by a health care professional. Linda [name] will be observed for 15 minutes after each dose. Sometimes, fainting happens after the vaccine is given. You may be asked to sit or lie down during the 15 minutes. Three doses are given. The second dose is given 2 months after the first dose. The last dose is given 4 months after the second dose. A copy of a Vaccine Information Statement will be given before each vaccination. Read this sheet carefully each time. The sheet may change frequently. Talk to your pediatrician regarding the use of this medicine in children. While this drug may be prescribed for children as Linda [name] as 45 years of age for selected conditions, precautions do apply. Overdosage: If you think you have taken too much of this medicine contact a poison control  center or emergency room at once. NOTE: This medicine is only for you. Do not share this medicine with others. What if I miss a dose? All 3 doses of the vaccine should be given within 6 months. Remember to keep appointments for follow-up doses. Your health care provider will tell you when to return for the next vaccine. Ask your health care professional for advice if you are unable to keep an appointment or miss a scheduled dose. What may interact with this medicine? -other vaccines This list may not describe all possible interactions. Give your health care provider a list of all the medicines, herbs, non-prescription drugs, or dietary supplements you use. Also tell them if you smoke, drink alcohol, or use illegal drugs. Some items may interact with your medicine. What should I watch for while using this medicine? This vaccine may not fully protect everyone. Continue to have regular pelvic exams and cervical or anal cancer screenings as directed by your doctor. The Human Papillomavirus is a sexually transmitted disease. It can be passed by any kind of sexual activity that involves genital contact. The vaccine works best when given before you have any contact with the virus. Many people who have the virus do not have any signs or symptoms. Tell your doctor or health care professional if you have any reaction or unusual symptom after getting the vaccine. What side effects may I notice from receiving this medicine? Side effects that you should report to your doctor or health care professional as soon as possible: -allergic reactions like skin rash, itching or hives, swelling of the face, lips,  or tongue -breathing problems -feeling faint or lightheaded, falls Side effects that usually do not require medical attention (report to your doctor or health care professional if they continue or are bothersome): -cough -dizziness -fever -headache -nausea -redness, warmth, swelling, pain, or itching at site  where injected This list may not describe all possible side effects. Call your doctor for medical advice about side effects. You may report side effects to FDA at 1-800-FDA-1088. Where should I keep my medicine? This drug is given in a hospital or clinic and will not be stored at home. NOTE: This sheet is a summary. It may not cover all possible information. If you have questions about this medicine, talk to your doctor, pharmacist, or health care provider.    2016, Elsevier/Gold Standard. (2013-06-19 13:14:33) Health Maintenance, Female Adopting a healthy lifestyle and getting preventive care can go a long way to promote health and wellness. Talk with your health care provider about what schedule of regular examinations is right for you. This is a good chance for you to check in with your provider about disease prevention and staying healthy. In between checkups, there are plenty of things you can do on your own. Experts have done a lot of research about which lifestyle changes and preventive measures are most likely to keep you healthy. Ask your health care provider for more information. WEIGHT AND DIET  Eat a healthy diet  Be sure to include plenty of vegetables, fruits, low-fat dairy products, and lean protein.  Do not eat a lot of foods high in solid fats, added sugars, or salt.  Get regular exercise. This is one of the most important things you can do for your health.  Most adults should exercise for at least 150 minutes each week. The exercise should increase your heart rate and make you sweat (moderate-intensity exercise).  Most adults should also do strengthening exercises at least twice a week. This is in addition to the moderate-intensity exercise.  Maintain a healthy weight  Body mass index (BMI) is a measurement that can be used to identify possible weight problems. It estimates body fat based on height and weight. Your health care provider can help determine your BMI and help  you achieve or maintain a healthy weight.  For females 77 years of age and older:   A BMI below 18.5 is considered underweight.  A BMI of 18.5 to 24.9 is normal.  A BMI of 25 to 29.9 is considered overweight.  A BMI of 30 and above is considered obese.  Watch levels of cholesterol and blood lipids  You should start having your blood tested for lipids and cholesterol at 47 years of age, then have this test every 5 years.  You may need to have your cholesterol levels checked more often if:  Your lipid or cholesterol levels are high.  You are older than 47 years of age.  You are at high risk for heart disease.  CANCER SCREENING   Lung Cancer  Lung cancer screening is recommended for adults 53-63 years old who are at high risk for lung cancer because of a history of smoking.  A yearly low-dose CT scan of the lungs is recommended for people who:  Currently smoke.  Have quit within the past 15 years.  Have at least a 30-pack-year history of smoking. A pack year is smoking an average of one pack of cigarettes a day for 1 year.  Yearly screening should continue until it has been 15 years since you  quit.  Yearly screening should stop if you develop a health problem that would prevent you from having lung cancer treatment.  Breast Cancer  Practice breast self-awareness. This means understanding how your breasts normally appear and feel.  It also means doing regular breast self-exams. Let your health care provider know about any changes, no matter how small.  If you are in your 20s or 30s, you should have a clinical breast exam (CBE) by a health care provider every 1-3 years as part of a regular health exam.  If you are 40 or older, have a CBE every year. Also consider having a breast X-ray (mammogram) every year.  If you have a family history of breast cancer, talk to your health care provider about genetic screening.  If you are at high risk for breast cancer, talk to  your health care provider about having an MRI and a mammogram every year.  Breast cancer gene (BRCA) assessment is recommended for women who have family members with BRCA-related cancers. BRCA-related cancers include:  Breast.  Ovarian.  Tubal.  Peritoneal cancers.  Results of the assessment will determine the need for genetic counseling and BRCA1 and BRCA2 testing. Cervical Cancer Your health care provider may recommend that you be screened regularly for cancer of the pelvic organs (ovaries, uterus, and vagina). This screening involves a pelvic examination, including checking for microscopic changes to the surface of your cervix (Pap test). You may be encouraged to have this screening done every 3 years, beginning at age 47.  For women ages 34-65, health care providers may recommend pelvic exams and Pap testing every 3 years, or they may recommend the Pap and pelvic exam, combined with testing for human papilloma virus (HPV), every 5 years. Some types of HPV increase your risk of cervical cancer. Testing for HPV may also be done on women of any age with unclear Pap test results.  Other health care providers may not recommend any screening for nonpregnant women who are considered low risk for pelvic cancer and who do not have symptoms. Ask your health care provider if a screening pelvic exam is right for you.  If you have had past treatment for cervical cancer or a condition that could lead to cancer, you need Pap tests and screening for cancer for at least 20 years after your treatment. If Pap tests have been discontinued, your risk factors (such as having a new sexual partner) need to be reassessed to determine if screening should resume. Some women have medical problems that increase the chance of getting cervical cancer. In these cases, your health care provider may recommend more frequent screening and Pap tests. Colorectal Cancer  This type of cancer can be detected and often  prevented.  Routine colorectal cancer screening usually begins at 47 years of age and continues through 47 years of age.  Your health care provider may recommend screening at an earlier age if you have risk factors for colon cancer.  Your health care provider may also recommend using home test kits to check for hidden blood in the stool.  A small camera at the end of a tube can be used to examine your colon directly (sigmoidoscopy or colonoscopy). This is done to check for the earliest forms of colorectal cancer.  Routine screening usually begins at age 7.  Direct examination of the colon should be repeated every 5-10 years through 47 years of age. However, you may need to be screened more often if early forms of precancerous  polyps or small growths are found. Skin Cancer  Check your skin from head to toe regularly.  Tell your health care provider about any new moles or changes in moles, especially if there is a change in a mole's shape or color.  Also tell your health care provider if you have a mole that is larger than the size of a pencil eraser.  Always use sunscreen. Apply sunscreen liberally and repeatedly throughout the day.  Protect yourself by wearing long sleeves, pants, a wide-brimmed hat, and sunglasses whenever you are outside. HEART DISEASE, DIABETES, AND HIGH BLOOD PRESSURE   High blood pressure causes heart disease and increases the risk of stroke. High blood pressure is more likely to develop in:  People who have blood pressure in the high end of the normal range (130-139/85-89 mm Hg).  People who are overweight or obese.  People who are African American.  If you are 69-69 years of age, have your blood pressure checked every 3-5 years. If you are 23 years of age or older, have your blood pressure checked every year. You should have your blood pressure measured twice--once when you are at a hospital or clinic, and once when you are not at a hospital or clinic.  Record the average of the two measurements. To check your blood pressure when you are not at a hospital or clinic, you can use:  An automated blood pressure machine at a pharmacy.  A home blood pressure monitor.  If you are between 44 years and 29 years old, ask your health care provider if you should take aspirin to prevent strokes.  Have regular diabetes screenings. This involves taking a blood sample to check your fasting blood sugar level.  If you are at a normal weight and have a low risk for diabetes, have this test once every three years after 47 years of age.  If you are overweight and have a high risk for diabetes, consider being tested at a younger age or more often. PREVENTING INFECTION  Hepatitis B  If you have a higher risk for hepatitis B, you should be screened for this virus. You are considered at high risk for hepatitis B if:  You were born in a country where hepatitis B is common. Ask your health care provider which countries are considered high risk.  Your parents were born in a high-risk country, and you have not been immunized against hepatitis B (hepatitis B vaccine).  You have HIV or AIDS.  You use needles to inject street drugs.  You live with someone who has hepatitis B.  You have had sex with someone who has hepatitis B.  You get hemodialysis treatment.  You take certain medicines for conditions, including cancer, organ transplantation, and autoimmune conditions. Hepatitis C  Blood testing is recommended for:  Everyone born from [birth year] through 1967.  Anyone with known risk factors for hepatitis C. Sexually transmitted infections (STIs)  You should be screened for sexually transmitted infections (STIs) including gonorrhea and chlamydia if:  You are sexually active and are younger than 47 years of age.  You are older than 47 years of age and your health care provider tells you that you are at risk for this type of infection.  Your sexual activity  has changed since you were last screened and you are at an increased risk for chlamydia or gonorrhea. Ask your health care provider if you are at risk.  If you do not have HIV, but are at risk, it  may be recommended that you take a prescription medicine daily to prevent HIV infection. This is called pre-exposure prophylaxis (PrEP). You are considered at risk if:  You are sexually active and do not regularly use condoms or know the HIV status of your partner(s).  You take drugs by injection.  You are sexually active with a partner who has HIV. Talk with your health care provider about whether you are at high risk of being infected with HIV. If you choose to begin PrEP, you should first be tested for HIV. You should then be tested every 3 months for as long as you are taking PrEP.  PREGNANCY   If you are premenopausal and you may become pregnant, ask your health care provider about preconception counseling.  If you may become pregnant, take 400 to 800 micrograms (mcg) of folic acid every day.  If you want to prevent pregnancy, talk to your health care provider about birth control (contraception). OSTEOPOROSIS AND MENOPAUSE   Osteoporosis is a disease in which the bones lose minerals and strength with aging. This can result in serious bone fractures. Your risk for osteoporosis can be identified using a bone density scan.  If you are 62 years of age or older, or if you are at risk for osteoporosis and fractures, ask your health care provider if you should be screened.  Ask your health care provider whether you should take a calcium or vitamin D supplement to lower your risk for osteoporosis.  Menopause may have certain physical symptoms and risks.  Hormone replacement therapy may reduce some of these symptoms and risks. Talk to your health care provider about whether hormone replacement therapy is right for you.  HOME CARE INSTRUCTIONS   Schedule regular health, dental, and eye  exams.  Stay current with your immunizations.   Do not use any tobacco products including cigarettes, chewing tobacco, or electronic cigarettes.  If you are pregnant, do not drink alcohol.  If you are breastfeeding, limit how much and how often you drink alcohol.  Limit alcohol intake to no more than 1 drink per day for nonpregnant women. One drink equals 12 ounces of beer, 5 ounces of wine, or 1 ounces of hard liquor.  Do not use street drugs.  Do not share needles.  Ask your health care provider for help if you need support or information about quitting drugs.  Tell your health care provider if you often feel depressed.  Tell your health care provider if you have ever been abused or do not feel safe at home.   This information is not intended to replace advice given to you by your health care provider. Make sure you discuss any questions you have with your health care provider.   Document Released: 11/10/2010 Document Revised: 05/18/2014 Document Reviewed: 03/29/2013 Elsevier Interactive Patient Education [employer].

## 2015-08-29 LAB — URINALYSIS W MICROSCOPIC + REFLEX CULTURE
BACTERIA UA: NONE SEEN [HPF]
BILIRUBIN URINE: NEGATIVE
Casts: NONE SEEN [LPF]
Crystals: NONE SEEN [HPF]
GLUCOSE, UA: NEGATIVE
HGB URINE DIPSTICK: NEGATIVE
KETONES UR: NEGATIVE
LEUKOCYTES UA: NEGATIVE
Nitrite: NEGATIVE
PROTEIN: NEGATIVE
RBC / HPF: NONE SEEN RBC/HPF (ref <=2)
Specific Gravity, Urine: 1.014 (ref 1.001–1.035)
WBC UA: NONE SEEN WBC/HPF (ref <=5)
YEAST: NONE SEEN [HPF]
pH: 7 (ref 5.0–8.0)

## 2015-09-02 ENCOUNTER — Telehealth: Payer: Self-pay | Admitting: Family Medicine

## 2015-09-02 MED ORDER — TEMAZEPAM 30 MG PO CAPS: 30.0000 mg | ORAL_CAPSULE | Freq: Every evening | ORAL | Status: DC | PRN

## 2015-09-02 NOTE — Telephone Encounter (Signed)
It looks like the old rx was cancelled for some reason. Please call in #30 with 5 rf

## 2015-09-02 NOTE — Telephone Encounter (Addendum)
Pt states she is out of  temazepam (RESTORIL) 30 MG capsule [[id number]][[id number]] Pt unsure why she is short, because she only takes one/ night. Pt states she cannot sleep without, Would like to if Dr Clent RidgesFry will help get her Rx asap. Can Dr Clent RidgesFry send in a rx for 5 tabs only, or call pharmacy to ok an early refill.?  If he OKs an early refill, will this mess her up for next month?  [facility]/ [place]  Pt had CPE on 08/06/2015

## 2015-09-02 NOTE — Telephone Encounter (Signed)
I called in script 

## 2015-10-29 ENCOUNTER — Other Ambulatory Visit: Payer: Self-pay | Admitting: Family Medicine

## 2015-10-29 DIAGNOSIS — Z1231 Encounter for screening mammogram for malignant neoplasm of breast: Secondary | ICD-10-CM

## 2015-10-29 DIAGNOSIS — Z9882 Breast implant status: Secondary | ICD-10-CM

## 2015-11-18 ENCOUNTER — Ambulatory Visit
Admission: RE | Admit: 2015-11-18 | Discharge: 2015-11-18 | Disposition: A | Discharge status: Home / Self Care | Payer: PRIVATE HEALTH INSURANCE | Source: Ambulatory Visit | Attending: Family Medicine | Admitting: Family Medicine

## 2015-11-18 DIAGNOSIS — Z9882 Breast implant status: Secondary | ICD-10-CM

## 2015-11-18 DIAGNOSIS — Z1231 Encounter for screening mammogram for malignant neoplasm of breast: Secondary | ICD-10-CM

## 2015-11-26 ENCOUNTER — Other Ambulatory Visit: Payer: Self-pay | Admitting: Family Medicine

## 2015-12-17 ENCOUNTER — Other Ambulatory Visit: Payer: Self-pay | Admitting: Family Medicine

## 2016-02-23 ENCOUNTER — Other Ambulatory Visit: Payer: Self-pay | Admitting: Family Medicine

## 2016-02-25 NOTE — Telephone Encounter (Signed)
Call in #30 with 5 rf 

## 2016-08-21 ENCOUNTER — Other Ambulatory Visit: Payer: Self-pay | Admitting: Family Medicine

## 2016-08-21 NOTE — Telephone Encounter (Signed)
Call in #30 with one rf only. She needs an OV soon.

## 2016-09-04 ENCOUNTER — Ambulatory Visit (INDEPENDENT_AMBULATORY_CARE_PROVIDER_SITE_OTHER): Payer: PRIVATE HEALTH INSURANCE | Admitting: Obstetrics & Gynecology

## 2016-09-04 ENCOUNTER — Encounter: Payer: Self-pay | Admitting: Obstetrics & Gynecology

## 2016-09-04 VITALS — BP 94/60 | Ht 65.0 in | Wt 112.6 lb

## 2016-09-04 DIAGNOSIS — L298 Other pruritus: Secondary | ICD-10-CM | POA: Diagnosis not present

## 2016-09-04 DIAGNOSIS — R3989 Other symptoms and signs involving the genitourinary system: Secondary | ICD-10-CM | POA: Diagnosis not present

## 2016-09-04 DIAGNOSIS — N898 Other specified noninflammatory disorders of vagina: Secondary | ICD-10-CM

## 2016-09-04 LAB — WET PREP FOR TRICH, YEAST, CLUE
CLUE CELLS WET PREP: NONE SEEN
TRICH WET PREP: NONE SEEN
YEAST WET PREP: NONE SEEN

## 2016-09-04 LAB — URINALYSIS W MICROSCOPIC + REFLEX CULTURE
Bilirubin Urine: NEGATIVE
Casts: NONE SEEN [LPF]
Crystals: NONE SEEN [HPF]
GLUCOSE, UA: NEGATIVE
Hgb urine dipstick: NEGATIVE
Ketones, ur: NEGATIVE
LEUKOCYTES UA: NEGATIVE
NITRITE: NEGATIVE
PH: 5.5 (ref 5.0–8.0)
Protein, ur: NEGATIVE
RBC / HPF: NONE SEEN RBC/HPF (ref <=2)
Specific Gravity, Urine: 1.025 (ref 1.001–1.035)
Yeast: NONE SEEN [HPF]

## 2016-09-04 NOTE — Patient Instructions (Signed)
Your Gynecologic exam, wet prep and Urine analysis were all normal today.  You may have had a Yeast vaginitis, but the Diflucan you took cleared it.  Probiotic tab/suppository vaginally can help prevent Yeast Vaginitis recurrences.  I recommend that once a week as needed. Please schedule your Annual/Gyn exam with Harriett Sine.

## 2016-09-04 NOTE — Progress Notes (Signed)
    Linda [name] 1971/03/30 [id number]        46 y.o.  G0P0000 Married.  S/P Vaginal Hysterectomy.  RP:  Vaginal itching with mild d/c and dark urine x 2 weeks  A little better post Diflucan 1 tab PO a week ago (left over she had from last yr).  Still mild vaginal itching and d/c.  No pelvic pain.  Urine seems concentrated per patient, in spite of drinking plenty of water.  No UTI Sx, no frequency, no burning with miction.  No fever.  Declines STI screen.  Past medical history,surgical history, problem list, medications, allergies, family history and social history were all reviewed and documented in the EPIC chart.  Directed ROS with pertinent positives and negatives documented in the history of present illness/assessment and plan.  Exam:  Vitals:   09/04/16 1534  BP: 94/60  Weight: 112 lb 9.6 oz (51.1 kg)  Height:  (1.651 m)   General appearance:  Normal  Gyn exam:  Vulva normal, no erythema, no oedema, no lesion seen.                    Speculum:  Mild increase in whitish d/c.  Wet prep done.  Assessment/Plan:  46 y.o. G0P0000  1. Vaginal itching Probably cleared with Diflucan tab taken last week.  Probiotic Tab/Supp vaginally qweek PRN recommended. - WET PREP FOR TRICH, YEAST, CLUE:  Normal  2. Dark yellow-colored urine Reassured that U/A completely normal. - Urinalysis with Culture Reflex:  Normal  Counseling >50% x 15 min on above issues.  Genia Del MD, 3:56 PM 09/04/2016

## 2016-09-06 LAB — URINE CULTURE: Organism ID, Bacteria: NO GROWTH

## 2016-09-21 ENCOUNTER — Encounter: Payer: PRIVATE HEALTH INSURANCE | Admitting: Women's Health

## 2016-09-23 ENCOUNTER — Other Ambulatory Visit: Payer: Self-pay | Admitting: Family Medicine

## 2016-09-25 ENCOUNTER — Encounter: Payer: Self-pay | Admitting: Family Medicine

## 2016-09-25 ENCOUNTER — Ambulatory Visit (INDEPENDENT_AMBULATORY_CARE_PROVIDER_SITE_OTHER): Payer: PRIVATE HEALTH INSURANCE | Admitting: Family Medicine

## 2016-09-25 VITALS — BP 98/68 | HR 79 | Temp 98.1°F | Ht 65.0 in | Wt 114.0 lb

## 2016-09-25 DIAGNOSIS — F418 Other specified anxiety disorders: Secondary | ICD-10-CM

## 2016-09-25 DIAGNOSIS — F5101 Primary insomnia: Secondary | ICD-10-CM

## 2016-09-25 MED ORDER — TEMAZEPAM 30 MG PO CAPS: 30.0000 mg | ORAL_CAPSULE | Freq: Every evening | ORAL | 0 refills | Status: DC | PRN

## 2016-09-25 MED ORDER — ESCITALOPRAM OXALATE 20 MG PO TABS: ORAL_TABLET | ORAL | 3 refills | Status: DC

## 2016-09-25 MED ORDER — TEMAZEPAM 30 MG PO CAPS: 30.0000 mg | ORAL_CAPSULE | Freq: Every evening | ORAL | 5 refills | Status: DC | PRN

## 2016-09-25 NOTE — Progress Notes (Signed)
   Subjective:    Patient ID: Linda [name], female    DOB: 02-09-1971, 46 y.o.   MRN: [id number]  HPI Here for refills. She is doing well and has no concerns. Her moods are excellent and she sleeps well. She exercises regularly.    Review of Systems  Constitutional: Negative.   Respiratory: Negative.   Cardiovascular: Negative.   Neurological: Negative.   Psychiatric/Behavioral: Negative.        Objective:   Physical Exam  Constitutional: She is oriented to person, place, and time. She appears well-developed and well-nourished.  Cardiovascular: Normal rate, regular rhythm, normal heart sounds and intact distal pulses.   Pulmonary/Chest: Effort normal and breath sounds normal.  Neurological: She is alert and oriented to person, place, and time.  Psychiatric: She has a normal mood and affect. Her behavior is normal. Thought content normal.          Assessment & Plan:  Her depression and insomnia are well managed. Refills were given. Gershon CraneStephen Suhana Wilner, MD

## 2016-11-06 ENCOUNTER — Other Ambulatory Visit: Payer: Self-pay | Admitting: Family Medicine

## 2016-11-06 DIAGNOSIS — Z1231 Encounter for screening mammogram for malignant neoplasm of breast: Secondary | ICD-10-CM

## 2016-11-12 ENCOUNTER — Ambulatory Visit
Admission: RE | Admit: 2016-11-12 | Discharge: 2016-11-12 | Disposition: A | Discharge status: Home / Self Care | Payer: PRIVATE HEALTH INSURANCE | Source: Ambulatory Visit | Attending: Family Medicine | Admitting: Family Medicine

## 2016-11-12 DIAGNOSIS — Z1231 Encounter for screening mammogram for malignant neoplasm of breast: Secondary | ICD-10-CM

## 2017-04-16 ENCOUNTER — Other Ambulatory Visit: Payer: Self-pay | Admitting: Family Medicine

## 2017-04-20 NOTE — Telephone Encounter (Signed)
Last OV 09/25/16 and prescription on 09/25/16 was written for 5 refills.

## 2017-04-20 NOTE — Telephone Encounter (Signed)
Copied from CRM [id number]. Topic: Quick Communication - Rx Refill/Question >> Apr 20, 2017  9:38 AM Crist InfanteHarrald, Kathy J wrote: Has the patient contacted their pharmacy? {yes  Pt following up on refill request from 12/07. Pt is completley out and has not been able to sleep temazepam (RESTORIL) 30 MG capsule  Preferred Pharmacy (with phone number or street name):[id number] - [place], [place] - [address] [phone] (Phone) [phone] (Fax)

## 2017-04-21 NOTE — Telephone Encounter (Signed)
Sent to PCP for approval.  

## 2017-04-21 NOTE — Telephone Encounter (Signed)
Call in #30 with 5 rf 

## 2017-04-21 NOTE — Telephone Encounter (Signed)
Copied from CRM [id number]. Topic: Quick Communication - Rx Refill/Question >> Apr 21, 2017  1:11 PM Alexander BergeronBarksdale, Linda B wrote: Pt called to check status of Rx, contact pt or pharmacy if needed

## 2017-04-22 NOTE — Telephone Encounter (Signed)
[facility] was called into [facility]. [facility] but realized this was NOT the pharmacy the pt wanted to use. Called [facility] to [facility] Rx that was called in. Called pt to advise her Rx was called into [facility].

## 2017-05-17 ENCOUNTER — Telehealth: Payer: Self-pay | Admitting: Family Medicine

## 2017-05-17 NOTE — Telephone Encounter (Signed)
Requesting refill of Restoril  LOV 09/25/16 with Dr. Clent RidgesFry

## 2017-05-17 NOTE — Telephone Encounter (Signed)
Copied from CRM [phone] [phone]. Topic: Quick Communication - See Telephone Encounter >> May 17, 2017  9:52 AM Arlyss Gandyichardson, Phineas Mcenroe N, NT wrote: CRM for notification. See Telephone encounter for: Pt wanting a refill of temazepam (RESTORIL). States that she is 1 day short due to her puppy tried to eat one and she was unable to take that one. [place] [facility] [place].   05/17/17.

## 2017-05-17 NOTE — Telephone Encounter (Signed)
Please okay the early refill  

## 2017-05-17 NOTE — Telephone Encounter (Signed)
Last OV 09/25/2016. Rx was last refilled 04/22/2017 disp 30 with 5 refills. I am guessing we will need to call the pharmacy to give the OK for an early refill. Sent to PCP for approval.

## 2017-05-18 NOTE — Telephone Encounter (Signed)
[facility] [place] [facility] to give them the OK to fill early. Pt is aware of this and voiced understanding.

## 2017-06-15 ENCOUNTER — Telehealth: Payer: Self-pay | Admitting: Family Medicine

## 2017-06-15 MED ORDER — TEMAZEPAM 15 MG PO CAPS: 30.0000 mg | ORAL_CAPSULE | Freq: Every evening | ORAL | 5 refills | Status: DC | PRN

## 2017-06-15 NOTE — Telephone Encounter (Signed)
Copied from CRM [id number]. Topic: Quick Communication - Rx Refill/Question >> Jun 15, 2017  3:21 PM Louie BunPalacios Medina, Rosey Batheresa D wrote: Medication: temazepam (RESTORIL) 30 MG capsule   Has the patient contacted their pharmacy? Yes, need change because it is back order.Change to 15 mg  2x a day because the 30 mg is back order til March.   (Agent: If no, request that the patient contact the pharmacy for the refill.)   Preferred Pharmacy (with phone number or street name): [id number] - [place], [place] - [address]   Agent: Please be advised that RX refills may take up to 3 business days. We ask that you follow-up with your pharmacy.

## 2017-06-15 NOTE — Telephone Encounter (Signed)
Temazepam (Restoril) refill  Last OV: 09/25/16 Last Refill:04/22/17 #30 capsules with 5 refills Pharmacy:[facility] [address] [facility] [address]

## 2017-06-15 NOTE — Telephone Encounter (Signed)
The 30 mg Temazepam is on back order, so we will change to 15 mg capsules

## 2017-06-16 MED ORDER — TEMAZEPAM 15 MG PO CAPS: 30.0000 mg | ORAL_CAPSULE | Freq: Every evening | ORAL | 5 refills | Status: DC | PRN

## 2017-06-16 NOTE — Addendum Note (Signed)
Addended by: Gracelyn NurseBLACKWELL, Jeneane Pieczynski P on: 06/16/2017 09:40 AM   Modules accepted: Orders

## 2017-06-16 NOTE — Telephone Encounter (Addendum)
Rx has been sent into pharmacy. Documented as faxed but [facility].

## 2017-06-21 ENCOUNTER — Ambulatory Visit (INDEPENDENT_AMBULATORY_CARE_PROVIDER_SITE_OTHER): Payer: PRIVATE HEALTH INSURANCE | Admitting: Family Medicine

## 2017-06-21 ENCOUNTER — Encounter: Payer: Self-pay | Admitting: Family Medicine

## 2017-06-21 VITALS — BP 110/70 | HR 76 | Temp 98.6°F | Wt 114.4 lb

## 2017-06-21 DIAGNOSIS — G8929 Other chronic pain: Secondary | ICD-10-CM | POA: Diagnosis not present

## 2017-06-21 DIAGNOSIS — H9191 Unspecified hearing loss, right ear: Secondary | ICD-10-CM | POA: Diagnosis not present

## 2017-06-21 DIAGNOSIS — M25512 Pain in left shoulder: Secondary | ICD-10-CM | POA: Diagnosis not present

## 2017-06-21 MED ORDER — MELOXICAM 15 MG PO TABS: 15.0000 mg | ORAL_TABLET | Freq: Every day | ORAL | 5 refills | Status: DC

## 2017-06-21 NOTE — Progress Notes (Signed)
   Subjective:    Patient ID: Linda [name], female    DOB: November 27, 1970, 47 y.o.   MRN: [id number]  HPI Here for 2 reasons. First she has noticed some trouble with hearing in the right ear, and lately it has gotten worse. She also has ringing in the ear at times. No ear pain and no dizziness. Second for the past month she has had an intermittent sharp pain in the right shoulder that only bothers her when she raises her arm above her head and when she extends the arm backwards, such as when putting on a jacket. The pain starts in the posterior shoulder and radiates down the posterior upper arm to the elbow. No numbness or weakness in the arm or hand. No neck or back pain. She has done nothing for it because she has no discomfort most of the time.    Review of Systems  Constitutional: Negative.   HENT: Positive for hearing loss and tinnitus. Negative for ear discharge, ear pain, sinus pressure and sneezing.   Respiratory: Negative.   Cardiovascular: Negative.   Musculoskeletal: Positive for arthralgias.       Objective:   Physical Exam  Constitutional: She is oriented to person, place, and time. She appears well-nourished. No distress.  HENT:  Right Ear: External ear normal.  Left Ear: External ear normal.  Nose: Nose normal.  Mouth/Throat: Oropharynx is clear and moist.  Eyes: Conjunctivae are normal.  Neck: No thyromegaly present.  Cardiovascular: Normal rate, regular rhythm, normal heart sounds and intact distal pulses.  Pulmonary/Chest: Effort normal and breath sounds normal. No respiratory distress. She has no wheezes. She has no rales.  Musculoskeletal:  The left shoulder is mildly tender posteriorly. No swelling. ROM is full but full abduction and external rotation causes some pain. No crepitus.   Lymphadenopathy:    She has no cervical adenopathy.  Neurological: She is alert and oriented to person, place, and time.          Assessment & Plan:  She seems to be having  some sensorineural hearing loss. We will refer her to ENT to evaluate further. The shoulder pain is consistent with impingement. She will try Meloxicam 15 mg daily for a few weeks. Recheck prn.  Gershon CraneStephen Marleni Gallardo, MD

## 2017-11-17 ENCOUNTER — Other Ambulatory Visit: Payer: Self-pay | Admitting: Family Medicine

## 2017-11-17 DIAGNOSIS — Z1231 Encounter for screening mammogram for malignant neoplasm of breast: Secondary | ICD-10-CM

## 2017-12-03 ENCOUNTER — Telehealth: Payer: Self-pay | Admitting: Family Medicine

## 2017-12-03 MED ORDER — LEXAPRO 20 MG PO TABS: 20.0000 mg | ORAL_TABLET | Freq: Every day | ORAL | 3 refills | Status: DC

## 2017-12-03 NOTE — Telephone Encounter (Signed)
Copied from CRM [id number]. Topic: Quick Communication - Rx Refill/Question >> Dec 03, 2017  3:49 PM Arlyss Gandyichardson, Leili Eskenazi N, NT wrote: Medication: escitalopram (LEXAPRO) 20 MG tablet   Has the patient contacted their pharmacy? No. (Agent: If no, request that the patient contact the pharmacy for the refill.) (Agent: If yes, when and what did the pharmacy advise?)  Preferred Pharmacy (with phone number or street name): Pt would like a hardcopy rx so that she may shop around for the lowest price.   Agent: Please be advised that RX refills may take up to 3 business days. We ask that you follow-up with your pharmacy.

## 2017-12-03 NOTE — Telephone Encounter (Signed)
The rx is ready to pick up  

## 2017-12-03 NOTE — Telephone Encounter (Signed)
Pt is requesting a hard copy to price shop

## 2017-12-03 NOTE — Telephone Encounter (Signed)
Last OV 06/21/2017  Last refilled 09/25/2016 disp 90 with 3 refills   Sent to PCP to advise

## 2017-12-20 NOTE — Telephone Encounter (Signed)
Called and advised pt that it is best she pick up hard copies for confidentiality reasons. Patient agreed and will pick up the prescription in the office.

## 2017-12-20 NOTE — Telephone Encounter (Signed)
Patient called and was under impression that this script was being mailed.  She called to change her address. At this time, she is request for the script to be mailed to her new address: [address]  [place] [id number]

## 2017-12-21 ENCOUNTER — Other Ambulatory Visit: Payer: Self-pay | Admitting: Family Medicine

## 2017-12-21 NOTE — Telephone Encounter (Signed)
last fill 06/16/17 Last OV 06/21/17  Ok to fill?

## 2017-12-21 NOTE — Telephone Encounter (Signed)
Please refill for 6 months 

## 2017-12-22 ENCOUNTER — Other Ambulatory Visit: Payer: Self-pay

## 2017-12-22 ENCOUNTER — Ambulatory Visit
Admission: RE | Admit: 2017-12-22 | Discharge: 2017-12-22 | Disposition: A | Discharge status: Home / Self Care | Payer: PRIVATE HEALTH INSURANCE | Source: Ambulatory Visit | Attending: Family Medicine | Admitting: Family Medicine

## 2017-12-22 DIAGNOSIS — Z1231 Encounter for screening mammogram for malignant neoplasm of breast: Secondary | ICD-10-CM

## 2017-12-22 MED ORDER — LEXAPRO 20 MG PO TABS: 20.0000 mg | ORAL_TABLET | Freq: Every day | ORAL | 3 refills | Status: DC

## 2017-12-22 NOTE — Telephone Encounter (Signed)
Rx has been called in  

## 2017-12-22 NOTE — Telephone Encounter (Signed)
Pt came to pick up hard copy, intal Rx was not printed.  Rx was reprinted.

## 2018-06-28 ENCOUNTER — Other Ambulatory Visit: Payer: Self-pay | Admitting: Family Medicine

## 2018-06-29 NOTE — Telephone Encounter (Signed)
Last refill given on 8/14 for #60 with 5 ref

## 2018-11-28 ENCOUNTER — Encounter: Payer: Self-pay | Admitting: Family Medicine

## 2018-11-28 NOTE — Telephone Encounter (Signed)
I suggest she set up a complete physical with me soon, and I can order the DEXA then. She will need a colonoscopy, etc, and we can discuss all this

## 2018-12-07 DIAGNOSIS — I8312 Varicose veins of left lower extremity with inflammation: Secondary | ICD-10-CM | POA: Diagnosis not present

## 2018-12-07 DIAGNOSIS — I8311 Varicose veins of right lower extremity with inflammation: Secondary | ICD-10-CM | POA: Diagnosis not present

## 2018-12-13 ENCOUNTER — Telehealth: Payer: Self-pay | Admitting: Family Medicine

## 2018-12-13 MED ORDER — LEXAPRO 20 MG PO TABS: 20.0000 mg | ORAL_TABLET | Freq: Every day | ORAL | 0 refills | Status: DC

## 2018-12-13 NOTE — Telephone Encounter (Signed)
Medication Refill - Medication: LEXAPRO 20 MG tablet   Has the patient contacted their pharmacy? Yes.  Pt states the pharmacy has been trying to get in contact with office. Please advise.  (Agent: If no, request that the patient contact the pharmacy for the refill.) (Agent: If yes, when and what did the pharmacy advise?)  Preferred Pharmacy (with phone number or street name): [facility] Phone # [phone]  Agent: Please be advised that RX refills may take up to 3 business days. We ask that you follow-up with your pharmacy.

## 2018-12-13 NOTE — Telephone Encounter (Signed)
Rx has been sent in. Patient needs an OV for further refills.  Left a detailed message on verified voice mail.

## 2018-12-15 DIAGNOSIS — I8312 Varicose veins of left lower extremity with inflammation: Secondary | ICD-10-CM | POA: Diagnosis not present

## 2018-12-15 DIAGNOSIS — I8311 Varicose veins of right lower extremity with inflammation: Secondary | ICD-10-CM | POA: Diagnosis not present

## 2018-12-20 ENCOUNTER — Other Ambulatory Visit: Payer: Self-pay

## 2018-12-20 NOTE — Telephone Encounter (Signed)
Patient states this medication was supposed to be sent to [facility] in [place].    Provider verbal RX Line:  [phone]  Patient is requesting a call back when this is sent.

## 2018-12-20 NOTE — Telephone Encounter (Signed)
See request °

## 2018-12-20 NOTE — Telephone Encounter (Signed)
Spoke to pt and informed her that we will resend the Rx. Called pharmacy left detailed Rx prescription on confidential vm. Pt notified of update.

## 2018-12-21 ENCOUNTER — Other Ambulatory Visit: Payer: Self-pay | Admitting: Family Medicine

## 2018-12-21 DIAGNOSIS — I8312 Varicose veins of left lower extremity with inflammation: Secondary | ICD-10-CM | POA: Diagnosis not present

## 2018-12-21 DIAGNOSIS — I8311 Varicose veins of right lower extremity with inflammation: Secondary | ICD-10-CM | POA: Diagnosis not present

## 2018-12-21 NOTE — Telephone Encounter (Signed)
Last filled 06/29/2018 Last OV 11/28/2018  Ok to fill?

## 2018-12-21 NOTE — Telephone Encounter (Signed)
Patient called back and reports pharmacy still has not received rx. Please f/u

## 2018-12-22 NOTE — Addendum Note (Signed)
Addended by: Rebecca Eaton on: 12/22/2018 08:13 AM   Modules accepted: Orders

## 2018-12-22 NOTE — Telephone Encounter (Signed)
Spoke with patient and informed her that I would call the Rx in again. Left a message on pharmacy voicemail with prescription information.

## 2018-12-28 ENCOUNTER — Encounter: Payer: Self-pay | Admitting: Gastroenterology

## 2018-12-28 ENCOUNTER — Encounter: Payer: Self-pay | Admitting: Family Medicine

## 2018-12-28 ENCOUNTER — Other Ambulatory Visit: Payer: Self-pay

## 2018-12-28 ENCOUNTER — Ambulatory Visit (INDEPENDENT_AMBULATORY_CARE_PROVIDER_SITE_OTHER): Payer: Self-pay | Admitting: Family Medicine

## 2018-12-28 ENCOUNTER — Other Ambulatory Visit: Payer: Self-pay | Admitting: Family Medicine

## 2018-12-28 VITALS — BP 110/62 | HR 78 | Temp 98.7°F | Wt 113.2 lb

## 2018-12-28 DIAGNOSIS — M810 Age-related osteoporosis without current pathological fracture: Secondary | ICD-10-CM

## 2018-12-28 DIAGNOSIS — Z0000 Encounter for general adult medical examination without abnormal findings: Secondary | ICD-10-CM

## 2018-12-28 DIAGNOSIS — Z1231 Encounter for screening mammogram for malignant neoplasm of breast: Secondary | ICD-10-CM

## 2018-12-28 LAB — HEPATIC FUNCTION PANEL
ALT: 9 U/L (ref 0–35)
AST: 12 U/L (ref 0–37)
Albumin: 4.8 g/dL (ref 3.5–5.2)
Alkaline Phosphatase: 43 U/L (ref 39–117)
Bilirubin, Direct: 0.2 mg/dL (ref 0.0–0.3)
Total Bilirubin: 1 mg/dL (ref 0.2–1.2)
Total Protein: 6.9 g/dL (ref 6.0–8.3)

## 2018-12-28 LAB — BASIC METABOLIC PANEL
BUN: 9 mg/dL (ref 6–23)
CO2: 29 mEq/L (ref 19–32)
Calcium: 9.7 mg/dL (ref 8.4–10.5)
Chloride: 101 mEq/L (ref 96–112)
Creatinine, Ser: 0.63 mg/dL (ref 0.40–1.20)
GFR: 100.02 mL/min (ref >60.00)
Glucose, Bld: 85 mg/dL (ref 70–99)
Potassium: 4.2 mEq/L (ref 3.5–5.1)
Sodium: 136 mEq/L (ref 135–145)

## 2018-12-28 LAB — LIPID PANEL
Cholesterol: 181 mg/dL (ref 0–200)
HDL: 58 mg/dL (ref >39.00)
LDL Cholesterol: 107 mg/dL — ABNORMAL HIGH (ref 0–99)
NonHDL: 123.2
Total CHOL/HDL Ratio: 3
Triglycerides: 80 mg/dL (ref 0.0–149.0)
VLDL: 16 mg/dL (ref 0.0–40.0)

## 2018-12-28 LAB — CBC WITH DIFFERENTIAL/PLATELET
Basophils Absolute: 0 10*3/uL (ref 0.0–0.1)
Basophils Relative: 0.3 % (ref 0.0–3.0)
Eosinophils Absolute: 0.1 10*3/uL (ref 0.0–0.7)
Eosinophils Relative: 1 % (ref 0.0–5.0)
HCT: 41.7 % (ref 36.0–46.0)
Hemoglobin: 14.1 g/dL (ref 12.0–15.0)
Lymphocytes Relative: 23.8 % (ref 12.0–46.0)
Lymphs Abs: 1.5 10*3/uL (ref 0.7–4.0)
MCHC: 33.9 g/dL (ref 30.0–36.0)
MCV: 88.9 fl (ref 78.0–100.0)
Monocytes Absolute: 0.3 10*3/uL (ref 0.1–1.0)
Monocytes Relative: 5.5 % (ref 3.0–12.0)
Neutro Abs: 4.3 10*3/uL (ref 1.4–7.7)
Neutrophils Relative %: 69.4 % (ref 43.0–77.0)
Platelets: 190 10*3/uL (ref 150.0–400.0)
RBC: 4.69 Mil/uL (ref 3.87–5.11)
RDW: 13.1 % (ref 11.5–15.5)
WBC: 6.2 10*3/uL (ref 4.0–10.5)

## 2018-12-28 LAB — TSH: TSH: 0.54 u[IU]/mL (ref 0.35–4.50)

## 2018-12-28 MED ORDER — LEXAPRO 20 MG PO TABS: 20.0000 mg | ORAL_TABLET | Freq: Every day | ORAL | 3 refills | Status: DC

## 2018-12-28 NOTE — Progress Notes (Signed)
   Subjective:    Patient ID: Linda [name], female    DOB: July 11, 1970, 48 y.o.   MRN: [id number]  HPI Here for a well exam. She is doing well in general. Her anxiety is stable and she sleeps well. She does mention some intermittent weakness, pain, and tingling in the right hand and fingers that started about 10 years ago. This flares up when she does things repeatedly, such as when doing a lot of typing.    Review of Systems  Constitutional: Negative.   HENT: Negative.   Eyes: Negative.   Respiratory: Negative.   Cardiovascular: Negative.   Gastrointestinal: Negative.   Genitourinary: Negative for decreased urine volume, difficulty urinating, dyspareunia, dysuria, enuresis, flank pain, frequency, hematuria, pelvic pain and urgency.  Musculoskeletal: Negative.   Skin: Negative.   Neurological: Positive for weakness and numbness.  Psychiatric/Behavioral: Negative.        Objective:   Physical Exam Constitutional:      General: She is not in acute distress.    Appearance: She is well-developed.  HENT:     Head: Normocephalic and atraumatic.     Right Ear: External ear normal.     Left Ear: External ear normal.     Nose: Nose normal.     Mouth/Throat:     Pharynx: No oropharyngeal exudate.  Eyes:     General: No scleral icterus.    Conjunctiva/sclera: Conjunctivae normal.     Pupils: Pupils are equal, round, and reactive to light.  Neck:     Musculoskeletal: Normal range of motion and neck supple.     Thyroid: No thyromegaly.     Vascular: No JVD.  Cardiovascular:     Rate and Rhythm: Normal rate and regular rhythm.     Heart sounds: Normal heart sounds. No murmur. No friction rub. No gallop.   Pulmonary:     Effort: Pulmonary effort is normal. No respiratory distress.     Breath sounds: Normal breath sounds. No wheezing or rales.  Chest:     Chest wall: No tenderness.  Abdominal:     General: Bowel sounds are normal. There is no distension.     Palpations:  Abdomen is soft. There is no mass.     Tenderness: There is no abdominal tenderness. There is no guarding or rebound.  Musculoskeletal: Normal range of motion.        General: No tenderness.  Lymphadenopathy:     Cervical: No cervical adenopathy.  Skin:    General: Skin is warm and dry.     Findings: No erythema or rash.  Neurological:     Mental Status: She is alert and oriented to person, place, and time.     Cranial Nerves: No cranial nerve deficit.     Motor: No abnormal muscle tone.     Coordination: Coordination normal.     Deep Tendon Reflexes: Reflexes are normal and symmetric. Reflexes normal.  Psychiatric:        Behavior: Behavior normal.        Thought Content: Thought content normal.        Judgment: Judgment normal.           Assessment & Plan:  Well exam. We discussed diet and exercise. Get fasting labs. Set up a DEXA. Set up a colonoscopy. She has some carpal tunnel syndrome in the right wrist, so I suggested she wear a wrist splint when relaxing at home or when sleeping .  Alysia Penna, MD

## 2019-01-18 DIAGNOSIS — I8311 Varicose veins of right lower extremity with inflammation: Secondary | ICD-10-CM | POA: Diagnosis not present

## 2019-01-18 DIAGNOSIS — I8312 Varicose veins of left lower extremity with inflammation: Secondary | ICD-10-CM | POA: Diagnosis not present

## 2019-01-20 ENCOUNTER — Other Ambulatory Visit: Payer: Self-pay

## 2019-01-20 ENCOUNTER — Ambulatory Visit (AMBULATORY_SURGERY_CENTER): Payer: Self-pay

## 2019-01-20 VITALS — Ht 65.0 in | Wt 113.8 lb

## 2019-01-20 DIAGNOSIS — Z1211 Encounter for screening for malignant neoplasm of colon: Secondary | ICD-10-CM

## 2019-01-20 MED ORDER — NA SULFATE-K SULFATE-MG SULF 17.5-3.13-1.6 GM/177ML PO SOLN: 1.0000 | Freq: Once | ORAL | 0 refills | Status: AC

## 2019-01-20 NOTE — Progress Notes (Signed)
Denies allergies to eggs or soy products. Denies complication of anesthesia or sedation. Denies use of weight loss medication. Denies use of O2.   Emmi instructions given for colonoscopy.  A 15.00 coupon for Suprep was given to the patient.  

## 2019-01-24 ENCOUNTER — Encounter: Payer: Self-pay | Admitting: Gastroenterology

## 2019-01-27 ENCOUNTER — Telehealth: Payer: Self-pay | Admitting: Gastroenterology

## 2019-01-27 NOTE — Telephone Encounter (Signed)
Pt's prep is $144 with [employer] -   Will do sample- I offered [facility] and pt declined due to volume  Suprep sample lot [id number] exp 05/22 as directed- Pt to Pick up 3rd floor desk today or Monday   [facility]

## 2019-02-01 ENCOUNTER — Encounter: Payer: Self-pay | Admitting: Gynecology

## 2019-02-02 ENCOUNTER — Telehealth: Payer: Self-pay

## 2019-02-02 NOTE — Telephone Encounter (Signed)
Covid-19 screening questions   Do you now or have you had a fever in the last 14 days? NO   Do you have any respiratory symptoms of shortness of breath or cough now or in the last 14 days? NO  Do you have any family members or close contacts with diagnosed or suspected Covid-19 in the past 14 days? NO  Have you been tested for Covid-19 and found to be positive? NO        

## 2019-02-03 ENCOUNTER — Ambulatory Visit (AMBULATORY_SURGERY_CENTER): Payer: BC Managed Care – PPO | Admitting: Gastroenterology

## 2019-02-03 ENCOUNTER — Other Ambulatory Visit: Payer: Self-pay

## 2019-02-03 ENCOUNTER — Encounter: Payer: Self-pay | Admitting: Gastroenterology

## 2019-02-03 VITALS — BP 104/64 | HR 61 | Temp 98.0°F | Resp 15 | Ht 65.0 in | Wt 113.0 lb

## 2019-02-03 DIAGNOSIS — Z1211 Encounter for screening for malignant neoplasm of colon: Secondary | ICD-10-CM | POA: Diagnosis not present

## 2019-02-03 HISTORY — PX: COLONOSCOPY: SHX174

## 2019-02-03 MED ORDER — SODIUM CHLORIDE 0.9 % IV SOLN: 500.0000 mL | Freq: Once | INTRAVENOUS | Status: DC

## 2019-02-03 NOTE — Progress Notes (Signed)
Temperature- Linda [name]  Left upper arm- had flu./shingles shot 01-31-19- area reddened  Pt's states no medical or surgical changes since previsit or office visit.

## 2019-02-03 NOTE — Op Note (Signed)
[facility] Patient Name: Linda [name] Procedure Date: 02/03/2019 9:18 AM MRN: [id number] Endoscopist: Mauri Pole , MD Age: 48 Referring MD:  Date of Birth: March 22, 1971 Gender: Female Account #: [id number] Procedure:                Colonoscopy Indications:              Screening for colorectal malignant neoplasm Medicines:                Monitored Anesthesia Care Procedure:                Pre-Anesthesia Assessment:                           - Prior to the procedure, a History and Physical                            was performed, and patient medications and                            allergies were reviewed. The patient's tolerance of                            previous anesthesia was also reviewed. The risks                            and benefits of the procedure and the sedation                            options and risks were discussed with the patient.                            All questions were answered, and informed consent                            was obtained. Prior Anticoagulants: The patient has                            taken no previous anticoagulant or antiplatelet                            agents. ASA Grade Assessment: II - A patient with                            mild systemic disease. After reviewing the risks                            and benefits, the patient was deemed in                            satisfactory condition to undergo the procedure.                           After obtaining informed consent, the colonoscope  was passed under direct vision. Throughout the                            procedure, the patient's blood pressure, pulse, and                            oxygen saturations were monitored continuously. The                            Colonoscope was introduced through the anus and                            advanced to the the cecum, identified by                            appendiceal orifice  and ileocecal valve. The                            colonoscopy was performed without difficulty. The                            patient tolerated the procedure well. The quality                            of the bowel preparation was excellent. The                            ileocecal valve, appendiceal orifice, and rectum                            were photographed. Scope In: 9:23:52 AM Scope Out: 9:42:46 AM Scope Withdrawal Time: 0 hours 8 minutes 39 seconds  Total Procedure Duration: 0 hours 18 minutes 54 seconds  Findings:                 The perianal and digital rectal examinations were                            normal.                           Non-bleeding internal hemorrhoids were found during                            retroflexion. The hemorrhoids were small.                           The exam was otherwise without abnormality. Complications:            No immediate complications. Estimated Blood Loss:     Estimated blood loss: none. Impression:               - Non-bleeding internal hemorrhoids.                           - The examination was otherwise normal.                           -  No specimens collected. Recommendation:           - Patient has a contact number available for                            emergencies. The signs and symptoms of potential                            delayed complications were discussed with the                            patient. Return to normal activities tomorrow.                            Written discharge instructions were provided to the                            patient.                           - Resume previous diet.                           - Continue present medications.                           - Repeat colonoscopy in 10 years for screening                            purposes. Napoleon FormKavitha V. Nandigam, MD 02/03/2019 9:46:10 AM This report has been signed electronically.

## 2019-02-03 NOTE — Patient Instructions (Signed)
Impression/Recommendations:  Hemorrhoid handout given to patient.  Resume previous diet. Continue present medications.  Repeat colonoscopy in 10 years for screening purposes.  YOU HAD AN ENDOSCOPIC PROCEDURE TODAY AT THE [facility]:   Refer to the procedure report that was given to you for any specific questions about what was found during the examination.  If the procedure report does not answer your questions, please call your gastroenterologist to clarify.  If you requested that your care partner not be given the details of your procedure findings, then the procedure report has been included in a sealed envelope for you to review at your convenience later.  YOU SHOULD EXPECT: Some feelings of bloating in the abdomen. Passage of more gas than usual.  Walking can help get rid of the air that was put into your GI tract during the procedure and reduce the bloating. If you had a lower endoscopy (such as a colonoscopy or flexible sigmoidoscopy) you may notice spotting of blood in your stool or on the toilet paper. If you underwent a bowel prep for your procedure, you may not have a normal bowel movement for a few days.  Please Note:  You might notice some irritation and congestion in your nose or some drainage.  This is from the oxygen used during your procedure.  There is no need for concern and it should clear up in a day or so.  SYMPTOMS TO REPORT IMMEDIATELY:   Following lower endoscopy (colonoscopy or flexible sigmoidoscopy):  Excessive amounts of blood in the stool  Significant tenderness or worsening of abdominal pains  Swelling of the abdomen that is new, acute  Fever of 100F or higher For urgent or emergent issues, a gastroenterologist can be reached at any hour by calling [phone].   DIET:  We do recommend a small meal at first, but then you may proceed to your regular diet.  Drink plenty of fluids but you should avoid alcoholic beverages for 24  hours.  ACTIVITY:  You should plan to take it easy for the rest of today and you should NOT DRIVE or use heavy machinery until tomorrow (because of the sedation medicines used during the test).    FOLLOW UP: Our staff will call the number listed on your records 48-72 hours following your procedure to check on you and address any questions or concerns that you may have regarding the information given to you following your procedure. If we do not reach you, we will leave a message.  We will attempt to reach you two times.  During this call, we will ask if you have developed any symptoms of COVID 19. If you develop any symptoms (ie: fever, flu-like symptoms, shortness of breath, cough etc.) before then, please call [phone].  If you test positive for Covid 19 in the 2 weeks post procedure, please call and report this information to us.    If any biopsies were taken you will be contacted by phone or by letter within the next 1-3 weeks.  Please call us at [phone] if you have not heard about the biopsies in 3 weeks.    SIGNATURES/CONFIDENTIALITY: You and/or your care partner have signed paperwork which will be entered into your electronic medical record.  These signatures attest to the fact that that the information above on your After Visit Summary has been reviewed and is understood.  Full responsibility of the confidentiality of this discharge information lies with you and/or your care-partner. 

## 2019-02-03 NOTE — Progress Notes (Signed)
A and O x3. Report to RN. Tolerated MAC anesthesia well.

## 2019-02-07 ENCOUNTER — Telehealth: Payer: Self-pay

## 2019-02-07 NOTE — Telephone Encounter (Signed)
  Follow up Call-  Call back number 02/03/2019  Post procedure Call Back phone  # [phone]  Permission to leave phone message Yes  Some recent data might be hidden     Patient questions:  Do you have a fever, pain , or abdominal swelling? No. Pain Score  0 *  Have you tolerated food without any problems? Yes.    Have you been able to return to your normal activities? Yes.    Do you have any questions about your discharge instructions: Diet   no Medications  No. Follow up visit  No.  Do you have questions or concerns about your Care? No.  Actions: * If pain score is 4 or above: No action needed, pain <4.  1. Have you developed a fever since your procedure? no  2.   Have you had an respiratory symptoms (SOB or cough) since your procedure? no  3.   Have you tested positive for COVID 19 since your procedure no  4.   Have you had any family members/close contacts diagnosed with the COVID 19 since your procedure?  no   If yes to any of these questions please route to Joylene John, RN and Alphonsa Gin, Therapist, sports.

## 2019-02-09 ENCOUNTER — Other Ambulatory Visit: Payer: Self-pay

## 2019-02-09 ENCOUNTER — Ambulatory Visit
Admission: RE | Admit: 2019-02-09 | Discharge: 2019-02-09 | Disposition: A | Discharge status: Home / Self Care | Payer: BC Managed Care – PPO | Source: Ambulatory Visit | Attending: Family Medicine | Admitting: Family Medicine

## 2019-02-09 DIAGNOSIS — Z1231 Encounter for screening mammogram for malignant neoplasm of breast: Secondary | ICD-10-CM

## 2019-02-13 ENCOUNTER — Other Ambulatory Visit: Payer: Self-pay | Admitting: Family Medicine

## 2019-02-13 DIAGNOSIS — R928 Other abnormal and inconclusive findings on diagnostic imaging of breast: Secondary | ICD-10-CM

## 2019-02-15 ENCOUNTER — Other Ambulatory Visit: Payer: Self-pay | Admitting: Family Medicine

## 2019-02-15 ENCOUNTER — Other Ambulatory Visit: Payer: Self-pay

## 2019-02-15 ENCOUNTER — Ambulatory Visit
Admission: RE | Admit: 2019-02-15 | Discharge: 2019-02-15 | Disposition: A | Discharge status: Home / Self Care | Payer: BC Managed Care – PPO | Source: Ambulatory Visit | Attending: Family Medicine | Admitting: Family Medicine

## 2019-02-15 DIAGNOSIS — R928 Other abnormal and inconclusive findings on diagnostic imaging of breast: Secondary | ICD-10-CM

## 2019-02-15 DIAGNOSIS — N6002 Solitary cyst of left breast: Secondary | ICD-10-CM | POA: Diagnosis not present

## 2019-02-15 DIAGNOSIS — R922 Inconclusive mammogram: Secondary | ICD-10-CM | POA: Diagnosis not present

## 2019-02-17 ENCOUNTER — Other Ambulatory Visit: Payer: BC Managed Care – PPO

## 2019-03-07 ENCOUNTER — Other Ambulatory Visit: Payer: Self-pay | Admitting: Family Medicine

## 2019-03-07 DIAGNOSIS — Z13820 Encounter for screening for osteoporosis: Secondary | ICD-10-CM

## 2019-03-09 ENCOUNTER — Other Ambulatory Visit: Payer: Self-pay

## 2019-03-09 ENCOUNTER — Ambulatory Visit
Admission: RE | Admit: 2019-03-09 | Discharge: 2019-03-09 | Disposition: A | Discharge status: Home / Self Care | Payer: BC Managed Care – PPO | Source: Ambulatory Visit | Attending: Family Medicine | Admitting: Family Medicine

## 2019-03-09 DIAGNOSIS — Z13820 Encounter for screening for osteoporosis: Secondary | ICD-10-CM

## 2019-03-09 DIAGNOSIS — M85851 Other specified disorders of bone density and structure, right thigh: Secondary | ICD-10-CM | POA: Diagnosis not present

## 2019-03-09 DIAGNOSIS — Z780 Asymptomatic menopausal state: Secondary | ICD-10-CM | POA: Diagnosis not present

## 2019-06-05 DIAGNOSIS — L814 Other melanin hyperpigmentation: Secondary | ICD-10-CM | POA: Diagnosis not present

## 2019-06-05 DIAGNOSIS — D2272 Melanocytic nevi of left lower limb, including hip: Secondary | ICD-10-CM | POA: Diagnosis not present

## 2019-06-05 DIAGNOSIS — D2262 Melanocytic nevi of left upper limb, including shoulder: Secondary | ICD-10-CM | POA: Diagnosis not present

## 2019-06-05 DIAGNOSIS — D485 Neoplasm of uncertain behavior of skin: Secondary | ICD-10-CM | POA: Diagnosis not present

## 2019-07-04 ENCOUNTER — Other Ambulatory Visit: Payer: Self-pay | Admitting: Family Medicine

## 2019-07-04 NOTE — Telephone Encounter (Signed)
Last filled 12/21/2018 Last OV 12/28/2018  Ok to fill?

## 2019-08-15 DIAGNOSIS — Z20828 Contact with and (suspected) exposure to other viral communicable diseases: Secondary | ICD-10-CM | POA: Diagnosis not present

## 2019-11-06 ENCOUNTER — Other Ambulatory Visit: Payer: Self-pay

## 2019-11-07 ENCOUNTER — Encounter: Payer: Self-pay | Admitting: Nurse Practitioner

## 2019-11-07 ENCOUNTER — Ambulatory Visit (INDEPENDENT_AMBULATORY_CARE_PROVIDER_SITE_OTHER): Payer: BC Managed Care – PPO | Admitting: Nurse Practitioner

## 2019-11-07 VITALS — BP 118/80 | Ht 65.0 in | Wt 112.0 lb

## 2019-11-07 DIAGNOSIS — N951 Menopausal and female climacteric states: Secondary | ICD-10-CM | POA: Diagnosis not present

## 2019-11-07 DIAGNOSIS — Z01419 Encounter for gynecological examination (general) (routine) without abnormal findings: Secondary | ICD-10-CM

## 2019-11-07 DIAGNOSIS — M8589 Other specified disorders of bone density and structure, multiple sites: Secondary | ICD-10-CM

## 2019-11-07 DIAGNOSIS — Z90710 Acquired absence of both cervix and uterus: Secondary | ICD-10-CM

## 2019-11-07 DIAGNOSIS — R232 Flushing: Secondary | ICD-10-CM

## 2019-11-07 MED ORDER — ESTRADIOL 0.1 MG/GM VA CREA: 1.0000 | TOPICAL_CREAM | Freq: Every day | VAGINAL | 12 refills | Status: DC

## 2019-11-07 NOTE — Progress Notes (Signed)
   Linda [name] [birth year] [id number]   History:  49 y.o. [id number] presents for annual exam with complaints of hot flashes, vaginal dryness with intercourse, and pelvic discomfort. 2010 TVH, no HRT. 2010 TVH due to ectopic pregnancy. She has began having bilateral hip pain that she describes as aching, worse with activity, and better with rest. History of osteopenia.   Gynecologic History No LMP recorded. Patient has had a hysterectomy.   Contraception: status post hysterectomy Last Pap: no longer screening Last mammogram: 02/09/2019. Results were: L breast cyst, negative for evidence of malignancy Last colonoscopy: 01/14/2019. Results were: normal Last Dexa: 03/09/2019. Results were: osteopenia t-score -1.6 FRAX 4.1% / 0.4%  Past medical history, past surgical history, family history and social history were all reviewed and documented in the EPIC chart.  ROS:  A ROS was performed and pertinent positives and negatives are included.  Exam:  Vitals:   11/07/19 1429  Weight: 112 lb (50.8 kg)  Height: 5\' 5"  (1.651 m)   Body mass index is 18.64 kg/m.  General appearance:  Normal Thyroid:  Symmetrical, normal in size, without palpable masses or nodularity. Respiratory  Auscultation:  Clear without wheezing or rhonchi Cardiovascular  Auscultation:  Regular rate, without rubs, murmurs or gallops  Edema/varicosities:  Not grossly evident Abdominal  Soft,nontender, without masses, guarding or rebound.  Liver/spleen:  No organomegaly noted  Hernia:  None appreciated  Skin  Inspection:  Grossly normal   Breasts: Examined lying and sitting. Bilateral breast implants  Right: Without masses, retractions, discharge or axillary adenopathy.   Left: Without masses, retractions, discharge or axillary adenopathy. Gentitourinary   Inguinal/mons:  Normal without inguinal adenopathy  External genitalia:  Normal  BUS/Urethra/Skene's glands:  Normal  Vagina:  Normal, atrophic  changes  Cervix:  Absent  Uterus:  Absent  Adnexa/parametria:     Rt: Without masses or tenderness.   Lt: Without masses or tenderness.  Anus and perineum: Normal  Digital rectal exam: Normal sphincter tone without palpated masses or tenderness  Assessment/Plan:  49 y.o. 42 for annual exam.   Well female exam with routine gynecological exam - Education provided on SBEs, importance of preventative screenings, current guidelines, high calcium diet, regular exercise, and multivitamin daily.   History of total vaginal hysterectomy - Has been having occasional hot flashes for the past year.   Osteopenia of multiple sites - recommend adding Vitamin D 1000 unit daily, regular exercises with weightbearing exercises  Menopausal vaginal dryness - Plan: estradiol (ESTRACE VAGINAL) 0.1 MG/GM vaginal cream. Apply nightly for 2 weeks, then decrease to every other night for 2 weeks, and then continue twice a week.   Hot flashes - Plan: FSH  Follow up in 1 year for annual   [id number] [facility], 2:30 PM 11/07/2019

## 2019-11-07 NOTE — Patient Instructions (Addendum)
Health Maintenance, Female Adopting a healthy lifestyle and getting preventive care are important in promoting health and wellness. Ask your health care provider about:  The right schedule for you to have regular tests and exams.  Things you can do on your own to prevent diseases and keep yourself healthy. What should I know about diet, weight, and exercise? Eat a healthy diet   Eat a diet that includes plenty of vegetables, fruits, low-fat dairy products, and lean protein.  Do not eat a lot of foods that are high in solid fats, added sugars, or sodium. Maintain a healthy weight Body mass index (BMI) is used to identify weight problems. It estimates body fat based on height and weight. Your health care provider can help determine your BMI and help you achieve or maintain a healthy weight. Get regular exercise Get regular exercise. This is one of the most important things you can do for your health. Most adults should:  Exercise for at least 150 minutes each week. The exercise should increase your heart rate and make you sweat (moderate-intensity exercise).  Do strengthening exercises at least twice a week. This is in addition to the moderate-intensity exercise.  Spend less time sitting. Even light physical activity can be beneficial. Watch cholesterol and blood lipids Have your blood tested for lipids and cholesterol at 51 years of age, then have this test every 5 years. Have your cholesterol levels checked more often if:  Your lipid or cholesterol levels are high.  You are older than 51 years of age.  You are at high risk for heart disease. What should I know about cancer screening? Depending on your health history and family history, you may need to have cancer screening at various ages. This may include screening for:  Breast cancer.  Cervical cancer.  Colorectal cancer.  Skin cancer.  Lung cancer. What should I know about heart disease, diabetes, and high blood  pressure? Blood pressure and heart disease  High blood pressure causes heart disease and increases the risk of stroke. This is more likely to develop in people who have high blood pressure readings, are of African descent, or are overweight.  Have your blood pressure checked: ? Every 3-5 years if you are 18-39 years of age. ? Every year if you are 40 years old or older. Diabetes Have regular diabetes screenings. This checks your fasting blood sugar level. Have the screening done:  Once every three years after age 40 if you are at a normal weight and have a low risk for diabetes.  More often and at a younger age if you are overweight or have a high risk for diabetes. What should I know about preventing infection? Hepatitis B If you have a higher risk for hepatitis B, you should be screened for this virus. Talk with your health care provider to find out if you are at risk for hepatitis B infection. Hepatitis C Testing is recommended for:  Everyone born from 1945 through 1965.  Anyone with known risk factors for hepatitis C. Sexually transmitted infections (STIs)  Get screened for STIs, including gonorrhea and chlamydia, if: ? You are sexually active and are younger than 51 years of age. ? You are older than 51 years of age and your health care provider tells you that you are at risk for this type of infection. ? Your sexual activity has changed since you were last screened, and you are at increased risk for chlamydia or gonorrhea. Ask your health care provider if   you are at risk.  Ask your health care provider about whether you are at high risk for HIV. Your health care provider may recommend a prescription medicine to help prevent HIV infection. If you choose to take medicine to prevent HIV, you should first get tested for HIV. You should then be tested every 3 months for as long as you are taking the medicine. Pregnancy  If you are about to stop having your period (premenopausal) and  you may become pregnant, seek counseling before you get pregnant.  Take 400 to 800 micrograms (mcg) of folic acid every day if you become pregnant.  Ask for birth control (contraception) if you want to prevent pregnancy. Osteoporosis and menopause Osteoporosis is a disease in which the bones lose minerals and strength with aging. This can result in bone fractures. If you are 65 years old or older, or if you are at risk for osteoporosis and fractures, ask your health care provider if you should:  Be screened for bone loss.  Take a calcium or vitamin D supplement to lower your risk of fractures.  Be given hormone replacement therapy (HRT) to treat symptoms of menopause. Follow these instructions at home: Lifestyle  Do not use any products that contain nicotine or tobacco, such as cigarettes, e-cigarettes, and chewing tobacco. If you need help quitting, ask your health care provider.  Do not use street drugs.  Do not share needles.  Ask your health care provider for help if you need support or information about quitting drugs. Alcohol use  Do not drink alcohol if: ? Your health care provider tells you not to drink. ? You are pregnant, may be pregnant, or are planning to become pregnant.  If you drink alcohol: ? Limit how much you use to 0-1 drink a day. ? Limit intake if you are breastfeeding.  Be aware of how much alcohol is in your drink. In the [place] one drink equals one 12 oz bottle of beer (355 mL), one 5 oz glass of wine (148 mL), or one 1 oz glass of hard liquor (44 mL). General instructions  Schedule regular health, dental, and eye exams.  Stay current with your vaccines.  Tell your health care provider if: ? You often feel depressed. ? You have ever been abused or do not feel safe at home. Summary  Adopting a healthy lifestyle and getting preventive care are important in promoting health and wellness.  Follow your health care provider's instructions about healthy  diet, exercising, and getting tested or screened for diseases.  Follow your health care provider's instructions on monitoring your cholesterol and blood pressure. This information is not intended to replace advice given to you by your health care provider. Make sure you discuss any questions you have with your health care provider. Document Revised: 04/20/2018 Document Reviewed: 04/20/2018 Elsevier Patient Education  2020 Elsevier Inc.  

## 2019-11-08 LAB — FOLLICLE STIMULATING HORMONE: FSH: 77.3 m[IU]/mL

## 2019-11-20 DIAGNOSIS — Z20822 Contact with and (suspected) exposure to covid-19: Secondary | ICD-10-CM | POA: Diagnosis not present

## 2020-01-01 ENCOUNTER — Ambulatory Visit (INDEPENDENT_AMBULATORY_CARE_PROVIDER_SITE_OTHER): Payer: BC Managed Care – PPO | Admitting: Family Medicine

## 2020-01-01 ENCOUNTER — Encounter: Payer: Self-pay | Admitting: Family Medicine

## 2020-01-01 ENCOUNTER — Other Ambulatory Visit: Payer: Self-pay | Admitting: Family Medicine

## 2020-01-01 ENCOUNTER — Other Ambulatory Visit: Payer: Self-pay

## 2020-01-01 VITALS — BP 116/78 | HR 61 | Temp 98.0°F | Ht 65.0 in | Wt 114.4 lb

## 2020-01-01 DIAGNOSIS — Z0000 Encounter for general adult medical examination without abnormal findings: Secondary | ICD-10-CM | POA: Diagnosis not present

## 2020-01-01 MED ORDER — LEXAPRO 20 MG PO TABS: 20.0000 mg | ORAL_TABLET | Freq: Every day | ORAL | 3 refills | Status: DC

## 2020-01-01 MED ORDER — TEMAZEPAM 15 MG PO CAPS: ORAL_CAPSULE | ORAL | 5 refills | Status: DC

## 2020-01-01 NOTE — Progress Notes (Signed)
   Subjective:    Patient ID: Linda [name], female    DOB: 08-20-1970, 49 y.o.   MRN: [id number]  HPI Here for a well exam. She feels fine.    Review of Systems  Constitutional: Negative.   HENT: Negative.   Eyes: Negative.   Respiratory: Negative.   Cardiovascular: Negative.   Gastrointestinal: Negative.   Genitourinary: Negative for decreased urine volume, difficulty urinating, dyspareunia, dysuria, enuresis, flank pain, frequency, hematuria, pelvic pain and urgency.  Musculoskeletal: Negative.   Skin: Negative.   Neurological: Negative.   Psychiatric/Behavioral: Negative.        Objective:   Physical Exam Constitutional:      General: She is not in acute distress.    Appearance: She is well-developed.  HENT:     Head: Normocephalic and atraumatic.     Right Ear: External ear normal.     Left Ear: External ear normal.     Nose: Nose normal.     Mouth/Throat:     Pharynx: No oropharyngeal exudate.  Eyes:     General: No scleral icterus.    Conjunctiva/sclera: Conjunctivae normal.     Pupils: Pupils are equal, round, and reactive to light.  Neck:     Thyroid: No thyromegaly.     Vascular: No JVD.  Cardiovascular:     Rate and Rhythm: Normal rate and regular rhythm.     Heart sounds: Normal heart sounds. No murmur heard.  No friction rub. No gallop.   Pulmonary:     Effort: Pulmonary effort is normal. No respiratory distress.     Breath sounds: Normal breath sounds. No wheezing or rales.  Chest:     Chest wall: No tenderness.  Abdominal:     General: Bowel sounds are normal. There is no distension.     Palpations: Abdomen is soft. There is no mass.     Tenderness: There is no abdominal tenderness. There is no guarding or rebound.  Musculoskeletal:        General: No tenderness. Normal range of motion.     Cervical back: Normal range of motion and neck supple.  Lymphadenopathy:     Cervical: No cervical adenopathy.  Skin:    General: Skin is warm and  dry.     Findings: No erythema or rash.  Neurological:     Mental Status: She is alert and oriented to person, place, and time.     Cranial Nerves: No cranial nerve deficit.     Motor: No abnormal muscle tone.     Coordination: Coordination normal.     Deep Tendon Reflexes: Reflexes are normal and symmetric. Reflexes normal.  Psychiatric:        Behavior: Behavior normal.        Thought Content: Thought content normal.        Judgment: Judgment normal.           Assessment & Plan:  Well exam. We discussed diet and exercise. Get fasting labs.  Gershon Crane, MD

## 2020-01-02 ENCOUNTER — Encounter: Payer: Self-pay | Admitting: Family Medicine

## 2020-01-02 LAB — CBC WITH DIFFERENTIAL/PLATELET
Absolute Monocytes: 288 cells/uL (ref 200–950)
Basophils Absolute: 39 cells/uL (ref 0–200)
Basophils Relative: 0.9 %
Eosinophils Absolute: 60 cells/uL (ref 15–500)
Eosinophils Relative: 1.4 %
HCT: 42.1 % (ref 35.0–45.0)
Hemoglobin: 13.9 g/dL (ref 11.7–15.5)
Lymphs Abs: 1277 cells/uL (ref 850–3900)
MCH: 29.5 pg (ref 27.0–33.0)
MCHC: 33 g/dL (ref 32.0–36.0)
MCV: 89.4 fL (ref 80.0–100.0)
MPV: 11.2 fL (ref 7.5–12.5)
Monocytes Relative: 6.7 %
Neutro Abs: 2636 cells/uL (ref 1500–7800)
Neutrophils Relative %: 61.3 %
Platelets: 221 10*3/uL (ref 140–400)
RBC: 4.71 10*6/uL (ref 3.80–5.10)
RDW: 12.3 % (ref 11.0–15.0)
Total Lymphocyte: 29.7 %
WBC: 4.3 10*3/uL (ref 3.8–10.8)

## 2020-01-02 LAB — LIPID PANEL
Cholesterol: 155 mg/dL (ref <200)
HDL: 57 mg/dL (ref >=50)
LDL Cholesterol (Calc): 84 mg/dL (calc)
Non-HDL Cholesterol (Calc): 98 mg/dL (calc) (ref <130)
Total CHOL/HDL Ratio: 2.7 (calc) (ref <5.0)
Triglycerides: 55 mg/dL (ref <150)

## 2020-01-02 LAB — HEPATIC FUNCTION PANEL
AG Ratio: 2.1 (calc) (ref 1.0–2.5)
ALT: 8 U/L (ref 6–29)
AST: 13 U/L (ref 10–35)
Albumin: 4.5 g/dL (ref 3.6–5.1)
Alkaline phosphatase (APISO): 39 U/L (ref 37–153)
Bilirubin, Direct: 0.2 mg/dL (ref 0.0–0.2)
Globulin: 2.1 g/dL (calc) (ref 1.9–3.7)
Indirect Bilirubin: 0.7 mg/dL (calc) (ref 0.2–1.2)
Total Bilirubin: 0.9 mg/dL (ref 0.2–1.2)
Total Protein: 6.6 g/dL (ref 6.1–8.1)

## 2020-01-02 LAB — BASIC METABOLIC PANEL
BUN: 11 mg/dL (ref 7–25)
CO2: 26 mmol/L (ref 20–32)
Calcium: 9.1 mg/dL (ref 8.6–10.4)
Chloride: 105 mmol/L (ref 98–110)
Creat: 0.66 mg/dL (ref 0.50–1.05)
Glucose, Bld: 86 mg/dL (ref 65–99)
Potassium: 4.1 mmol/L (ref 3.5–5.3)
Sodium: 138 mmol/L (ref 135–146)

## 2020-01-02 LAB — TSH: TSH: 0.72 mIU/L

## 2020-01-03 NOTE — Telephone Encounter (Signed)
I would take 1500 mg of calcium and 2000 units of vitamin D daily

## 2020-01-22 ENCOUNTER — Encounter: Payer: Self-pay | Admitting: Family Medicine

## 2020-01-22 DIAGNOSIS — H9190 Unspecified hearing loss, unspecified ear: Secondary | ICD-10-CM

## 2020-01-23 NOTE — Telephone Encounter (Signed)
I referred her to [facility]  

## 2020-01-31 ENCOUNTER — Other Ambulatory Visit: Payer: Self-pay | Admitting: Family Medicine

## 2020-01-31 ENCOUNTER — Encounter: Payer: Self-pay | Admitting: Family Medicine

## 2020-01-31 DIAGNOSIS — Z1231 Encounter for screening mammogram for malignant neoplasm of breast: Secondary | ICD-10-CM

## 2020-02-06 DIAGNOSIS — J309 Allergic rhinitis, unspecified: Secondary | ICD-10-CM | POA: Diagnosis not present

## 2020-02-06 DIAGNOSIS — H6121 Impacted cerumen, right ear: Secondary | ICD-10-CM | POA: Diagnosis not present

## 2020-02-20 DIAGNOSIS — H93293 Other abnormal auditory perceptions, bilateral: Secondary | ICD-10-CM | POA: Diagnosis not present

## 2020-02-28 ENCOUNTER — Ambulatory Visit: Payer: BC Managed Care – PPO

## 2020-03-22 ENCOUNTER — Ambulatory Visit: Payer: BC Managed Care – PPO

## 2020-04-22 ENCOUNTER — Other Ambulatory Visit: Payer: Self-pay

## 2020-04-22 ENCOUNTER — Ambulatory Visit
Admission: RE | Admit: 2020-04-22 | Discharge: 2020-04-22 | Disposition: A | Discharge status: Home / Self Care | Payer: BC Managed Care – PPO | Source: Ambulatory Visit | Attending: Family Medicine | Admitting: Family Medicine

## 2020-04-22 DIAGNOSIS — Z1231 Encounter for screening mammogram for malignant neoplasm of breast: Secondary | ICD-10-CM

## 2020-06-05 DIAGNOSIS — D225 Melanocytic nevi of trunk: Secondary | ICD-10-CM | POA: Diagnosis not present

## 2020-06-05 DIAGNOSIS — D2262 Melanocytic nevi of left upper limb, including shoulder: Secondary | ICD-10-CM | POA: Diagnosis not present

## 2020-06-05 DIAGNOSIS — L821 Other seborrheic keratosis: Secondary | ICD-10-CM | POA: Diagnosis not present

## 2020-06-05 DIAGNOSIS — L738 Other specified follicular disorders: Secondary | ICD-10-CM | POA: Diagnosis not present

## 2020-06-25 ENCOUNTER — Other Ambulatory Visit: Payer: Self-pay | Admitting: Family Medicine

## 2020-06-26 NOTE — Telephone Encounter (Signed)
  LAST APPOINTMENT DATE: 01/01/2020   NEXT APPOINTMENT DATE:@Visit  date not found  MEDICATION:  Temazepam  PHARMACY:  [facility] [address]  Let patient know to contact pharmacy at the end of the day to make sure medication is ready.  Please notify patient to allow 48-72 hours to process  Encourage patient to contact the pharmacy for refills or they can request refills through [facility]  CLINICAL FILLS OUT ALL BELOW:   LAST REFILL:  01/01/2020 QTY: 60 capsules with 5 refills    OTHER COMMENTS:    Okay for refill?  Please advise

## 2020-06-29 DIAGNOSIS — Z20822 Contact with and (suspected) exposure to covid-19: Secondary | ICD-10-CM | POA: Diagnosis not present

## 2020-06-29 DIAGNOSIS — Z03818 Encounter for observation for suspected exposure to other biological agents ruled out: Secondary | ICD-10-CM | POA: Diagnosis not present

## 2020-09-11 DIAGNOSIS — M720 Palmar fascial fibromatosis [Dupuytren]: Secondary | ICD-10-CM | POA: Diagnosis not present

## 2020-10-28 ENCOUNTER — Telehealth: Payer: Self-pay

## 2020-10-28 ENCOUNTER — Other Ambulatory Visit: Payer: Self-pay

## 2020-10-28 ENCOUNTER — Ambulatory Visit (INDEPENDENT_AMBULATORY_CARE_PROVIDER_SITE_OTHER): Payer: BC Managed Care – PPO | Admitting: Nurse Practitioner

## 2020-10-28 ENCOUNTER — Encounter: Payer: Self-pay | Admitting: Nurse Practitioner

## 2020-10-28 VITALS — BP 110/62 | HR 89 | Ht 64.5 in | Wt 112.0 lb

## 2020-10-28 DIAGNOSIS — K5904 Chronic idiopathic constipation: Secondary | ICD-10-CM

## 2020-10-28 DIAGNOSIS — N951 Menopausal and female climacteric states: Secondary | ICD-10-CM | POA: Diagnosis not present

## 2020-10-28 DIAGNOSIS — N941 Unspecified dyspareunia: Secondary | ICD-10-CM

## 2020-10-28 NOTE — Telephone Encounter (Signed)
Patient called in triage voice mail stating that she has not used her vaginal est cream for a week or two and today when she tried to insert it she had to stop half way inside vagina as "something hard stopped it, something is protruding". She stated she wanted to schedule appt. I staff message Debarah Crape to call her and arrange visit.

## 2020-10-28 NOTE — Progress Notes (Signed)
   Acute Office Visit  Subjective:    Patient ID: RAYEN PALEN, female    DOB: [birth year], 50 y.o.   MRN: [id number]   HPI 50 y.o. presents today for vaginal lump. While trying to insert applicator for vaginal estrogen she felt resistance and a bulge. She was able to get the applicator in after more pressure. She does have a history of constipation and had not had a bowel movement for about 2 weeks. She has been busy with her daughter being home from college and just has not paid attention/treated her constipation. She did have a bowel movement just prior to her appointment. She also complains of still having pain with intercourse. She misses doses every once in a while but it usually pretty consistent with it.    Review of Systems  Constitutional: Negative.   Gastrointestinal:  Positive for constipation.  Genitourinary:  Positive for dyspareunia. Negative for vaginal bleeding, vaginal discharge and vaginal pain.      Objective:    Physical Exam Constitutional:      Appearance: Normal appearance.  Genitourinary:    General: Normal vulva.     Vagina: Normal.     Rectum: Normal. No mass or tenderness. Normal anal tone.    BP 110/62   Pulse 89   Ht 5' 4.5" (1.638 m)   Wt 112 lb (50.8 kg)   SpO2 100%   BMI 18.93 kg/m  Wt Readings from Last 3 Encounters:  10/28/20 112 lb (50.8 kg)  01/01/20 114 lb 6.4 oz (51.9 kg)  11/07/19 112 lb (50.8 kg)        Assessment & Plan:   Problem List Items Addressed This Visit   None Visit Diagnoses     Menopausal vaginal dryness    -  Primary   Dyspareunia in female       Chronic idiopathic constipation          Plan: Reassurance provided on normal exam. It is likely constipation was the cause for the bulge felt within the vagina. She will take Dulcolax and increase water and fiber intake. Also recommended she increase vaginal estrogen to 3 times weekly and continue to use lubricant during intercourse. She returns for annual visit  in early July and we will reassess then. She is agreeable to plan.      Olivia Mackie DNP, 4:14 PM 10/28/2020

## 2020-11-07 ENCOUNTER — Encounter: Payer: BC Managed Care – PPO | Admitting: Nurse Practitioner

## 2020-11-13 ENCOUNTER — Telehealth: Payer: Self-pay | Admitting: *Deleted

## 2020-11-13 ENCOUNTER — Other Ambulatory Visit: Payer: Self-pay

## 2020-11-13 ENCOUNTER — Encounter: Payer: Self-pay | Admitting: Nurse Practitioner

## 2020-11-13 ENCOUNTER — Ambulatory Visit (INDEPENDENT_AMBULATORY_CARE_PROVIDER_SITE_OTHER): Payer: BC Managed Care – PPO | Admitting: Nurse Practitioner

## 2020-11-13 VITALS — BP 114/70 | Ht 65.0 in | Wt 114.0 lb

## 2020-11-13 DIAGNOSIS — Z79890 Hormone replacement therapy (postmenopausal): Secondary | ICD-10-CM

## 2020-11-13 DIAGNOSIS — N8189 Other female genital prolapse: Secondary | ICD-10-CM

## 2020-11-13 DIAGNOSIS — Z780 Asymptomatic menopausal state: Secondary | ICD-10-CM

## 2020-11-13 DIAGNOSIS — Z01411 Encounter for gynecological examination (general) (routine) with abnormal findings: Secondary | ICD-10-CM | POA: Diagnosis not present

## 2020-11-13 DIAGNOSIS — M8580 Other specified disorders of bone density and structure, unspecified site: Secondary | ICD-10-CM

## 2020-11-13 DIAGNOSIS — N816 Rectocele: Secondary | ICD-10-CM | POA: Diagnosis not present

## 2020-11-13 DIAGNOSIS — N951 Menopausal and female climacteric states: Secondary | ICD-10-CM | POA: Diagnosis not present

## 2020-11-13 DIAGNOSIS — Z01419 Encounter for gynecological examination (general) (routine) without abnormal findings: Secondary | ICD-10-CM

## 2020-11-13 MED ORDER — ESTRADIOL 0.05 MG/24HR TD PTWK: 0.0500 mg | MEDICATED_PATCH | TRANSDERMAL | 4 refills | Status: DC

## 2020-11-13 NOTE — Telephone Encounter (Signed)
Referral placed at [facility] physical therapy at [place], they will call to schedule.

## 2020-11-13 NOTE — Progress Notes (Signed)
Linda [name] Jan 08, 1971 [id number]   History:  50 y.o. [id number] presents for annual exam. Postmenopausal - no HRT. She is experiencing significant menopausal symptoms of hot flashes, mood changes, and insomnia. S/P 2010 TVH due to ectopic pregnancy. Using vaginal estrogen for vaginal dryness. Normal pap and mammogram history. Mild osteopenia.   Gynecologic History No LMP recorded. Patient has had a hysterectomy.   Contraception: status post hysterectomy Last Pap: no longer screening Last mammogram: 04/22/2020 Results were: Normal Last colonoscopy: 01/14/2019. Results were: Normal, 10-year recall Last Dexa: 03/09/2019. Results were: osteopenia t-score -1.6 FRAX 4.1% / 0.4%  Past medical history, past surgical history, family history and social history were all reviewed and documented in the EPIC chart. Married. Daughter at [employer] for finance, son at [employer] for business.  ROS:  A ROS was performed and pertinent positives and negatives are included.  Exam:  Vitals:   11/13/20 0935  BP: 114/70  Weight: 114 lb (51.7 kg)  Height: 5\' 5"  (1.651 m)    Body mass index is 18.97 kg/m.  General appearance:  Normal Thyroid:  Symmetrical, normal in size, without palpable masses or nodularity. Respiratory  Auscultation:  Clear without wheezing or rhonchi Cardiovascular  Auscultation:  Regular rate, without rubs, murmurs or gallops  Edema/varicosities:  Not grossly evident Abdominal  Soft,nontender, without masses, guarding or rebound.  Liver/spleen:  No organomegaly noted  Hernia:  None appreciated  Skin  Inspection:  Grossly normal   Breasts: Examined lying and sitting. Bilateral breast implants  Right: Without masses, retractions, discharge or axillary adenopathy.   Left: Without masses, retractions, discharge or axillary adenopathy. Gentitourinary   Inguinal/mons:  Normal without inguinal adenopathy  External genitalia:  Normal  BUS/Urethra/Skene's glands:  Normal  Vagina:   Normal, atrophic changes, rectocele and vaginal prolapse  Cervix:  Absent  Uterus:  Absent  Anus and perineum: Normal  Digital rectal exam: Normal sphincter tone without palpated masses or tenderness  Assessment/Plan:  50 y.o. 42 for annual exam.   Well female exam with routine gynecological exam - Education provided on SBEs, importance of preventative screenings, current guidelines, high calcium diet, regular exercise, and multivitamin daily. Labs with PCP.   Postmenopausal - FSH 77 last year  Menopausal vaginal dryness - Using vaginal estrogen twice weekly. She still experiences some discomfort with intercourse. Recommend she uses lubrication with intercourse as well.   Menopausal symptoms - Plan: estradiol (CLIMARA - DOSED IN MG/24 HR) 0.05 mg/24hr patch weekly. She is experiencing significant menopausal symptoms of hot flashes, mood changes, and insomnia.   Hormone replacement therapy - Plan: estradiol (CLIMARA - DOSED IN MG/24 HR) 0.05 mg/24hr patch weekly. We had a long discussion on the risks of blood clots, heart attack, stroke, and breast cancer. We also discussed the benefits of heart and bone health, as well as symptom relief. Aunt had PE in her 57s while traveling, niece had DVT at age 60 and 66 while on hormonal contraception, but no first degree relatives with history of blood clots or clotting disorders. S/P hysterectomy.   Rectocele - history of chronic constipation. Denies fecal incontinence or difficulty passing stool. She was seen recently for large bulge blocking vaginal opening and later had BM and assumes it was stool that was protruding in the vagina. No issues today.   Pelvic floor relaxation - She has some stress incontinence but is otherwise asymptomatic. We discussed causes to include vaginal births, abdominal surgery, and her history of chronic constipation. Management options include pelvic floor PT,  at-home pelvic floor strengthening exercises, and preventing  constipation. She would like a referral to PT.   Osteopenia - Dexa 03/09/2019 T-score -1.6 FRAX 4.1% / 0.4%. Continue high calcium diet, vitamin d supplement, and regular exercise. Will repeat at 2-year interval.   Screening for cervical cancer - Normal Pap history.  No longer screening per guidelines.   Screening for breast cancer - Normal mammogram history.  Continue annual screenings.  Normal breast exam today.  Screening for colon cancer - 2020 colonoscopy. Will repeat at GI's recommended interval.  Follow up in 1 year for annual   Olivia Mackie [facility], 9:47 AM 11/13/2020

## 2020-11-13 NOTE — Telephone Encounter (Signed)
-----   Message from Olivia Mackie, NP sent at 11/13/2020 10:31 AM EDT ----- Please send referral for Pelvic floor PT for rectocele and pelvic floor relaxation. Thank you.

## 2020-11-18 NOTE — Telephone Encounter (Signed)
Patient scheduled on 01/07/21

## 2020-12-02 ENCOUNTER — Encounter: Payer: Self-pay | Admitting: Nurse Practitioner

## 2020-12-02 ENCOUNTER — Other Ambulatory Visit: Payer: Self-pay | Admitting: Nurse Practitioner

## 2020-12-02 DIAGNOSIS — N951 Menopausal and female climacteric states: Secondary | ICD-10-CM

## 2020-12-02 NOTE — Telephone Encounter (Signed)
Medication refill request: Estradiol vaginal cream  Last AEX:  11-13-20 TW  Next AEX: not currently scheduled  Last MMG (if hormonal medication request):04-22-20 density C/BIRADS 1 negative  Refill authorized: Today, please advise.   Medication pended for #42.5g, 3RF. Please refill if appropriate.

## 2020-12-03 ENCOUNTER — Other Ambulatory Visit: Payer: Self-pay | Admitting: Nurse Practitioner

## 2020-12-03 DIAGNOSIS — Z79890 Hormone replacement therapy (postmenopausal): Secondary | ICD-10-CM

## 2020-12-03 MED ORDER — ESTRADIOL 0.05 MG/24HR TD PTTW: 1.0000 | MEDICATED_PATCH | TRANSDERMAL | 11 refills | Status: DC

## 2021-01-01 ENCOUNTER — Ambulatory Visit (INDEPENDENT_AMBULATORY_CARE_PROVIDER_SITE_OTHER): Payer: BC Managed Care – PPO | Admitting: Family Medicine

## 2021-01-01 ENCOUNTER — Other Ambulatory Visit: Payer: Self-pay

## 2021-01-01 ENCOUNTER — Encounter: Payer: Self-pay | Admitting: Family Medicine

## 2021-01-01 VITALS — BP 98/64 | HR 66 | Temp 98.0°F | Wt 113.0 lb

## 2021-01-01 DIAGNOSIS — M720 Palmar fascial fibromatosis [Dupuytren]: Secondary | ICD-10-CM | POA: Insufficient documentation

## 2021-01-01 DIAGNOSIS — Z0000 Encounter for general adult medical examination without abnormal findings: Secondary | ICD-10-CM

## 2021-01-01 LAB — T3, FREE: T3, Free: 2.8 pg/mL (ref 2.3–4.2)

## 2021-01-01 LAB — BASIC METABOLIC PANEL
BUN: 8 mg/dL (ref 6–23)
CO2: 28 mEq/L (ref 19–32)
Calcium: 9.5 mg/dL (ref 8.4–10.5)
Chloride: 102 mEq/L (ref 96–112)
Creatinine, Ser: 0.72 mg/dL (ref 0.40–1.20)
GFR: 96.4 mL/min (ref >60.00)
Glucose, Bld: 81 mg/dL (ref 70–99)
Potassium: 3.9 mEq/L (ref 3.5–5.1)
Sodium: 137 mEq/L (ref 135–145)

## 2021-01-01 LAB — CBC WITH DIFFERENTIAL/PLATELET
Basophils Absolute: 0 10*3/uL (ref 0.0–0.1)
Basophils Relative: 0.2 % (ref 0.0–3.0)
Eosinophils Absolute: 0.1 10*3/uL (ref 0.0–0.7)
Eosinophils Relative: 1.4 % (ref 0.0–5.0)
HCT: 39.8 % (ref 36.0–46.0)
Hemoglobin: 13.7 g/dL (ref 12.0–15.0)
Lymphocytes Relative: 21.2 % (ref 12.0–46.0)
Lymphs Abs: 1.3 10*3/uL (ref 0.7–4.0)
MCHC: 34.3 g/dL (ref 30.0–36.0)
MCV: 87.3 fl (ref 78.0–100.0)
Monocytes Absolute: 0.4 10*3/uL (ref 0.1–1.0)
Monocytes Relative: 5.7 % (ref 3.0–12.0)
Neutro Abs: 4.4 10*3/uL (ref 1.4–7.7)
Neutrophils Relative %: 71.5 % (ref 43.0–77.0)
Platelets: 179 10*3/uL (ref 150.0–400.0)
RBC: 4.56 Mil/uL (ref 3.87–5.11)
RDW: 13.1 % (ref 11.5–15.5)
WBC: 6.2 10*3/uL (ref 4.0–10.5)

## 2021-01-01 LAB — HEPATIC FUNCTION PANEL
ALT: 11 U/L (ref 0–35)
AST: 14 U/L (ref 0–37)
Albumin: 4.4 g/dL (ref 3.5–5.2)
Alkaline Phosphatase: 51 U/L (ref 39–117)
Bilirubin, Direct: 0.2 mg/dL (ref 0.0–0.3)
Total Bilirubin: 0.8 mg/dL (ref 0.2–1.2)
Total Protein: 6.5 g/dL (ref 6.0–8.3)

## 2021-01-01 LAB — LIPID PANEL
Cholesterol: 164 mg/dL (ref 0–200)
HDL: 53.8 mg/dL (ref >39.00)
LDL Cholesterol: 98 mg/dL (ref 0–99)
NonHDL: 110.29
Total CHOL/HDL Ratio: 3
Triglycerides: 62 mg/dL (ref 0.0–149.0)
VLDL: 12.4 mg/dL (ref 0.0–40.0)

## 2021-01-01 LAB — T4, FREE: Free T4: 0.75 ng/dL (ref 0.60–1.60)

## 2021-01-01 LAB — HEMOGLOBIN A1C: Hgb A1c MFr Bld: 5.2 % (ref 4.6–6.5)

## 2021-01-01 LAB — TSH: TSH: 0.71 u[IU]/mL (ref 0.35–5.50)

## 2021-01-01 MED ORDER — LEXAPRO 20 MG PO TABS: 20.0000 mg | ORAL_TABLET | Freq: Every day | ORAL | 3 refills | Status: DC

## 2021-01-01 MED ORDER — TEMAZEPAM 15 MG PO CAPS: 30.0000 mg | ORAL_CAPSULE | Freq: Every day | ORAL | 1 refills | Status: DC

## 2021-01-01 NOTE — Progress Notes (Signed)
Subjective:    Patient ID: Linda [name], female    DOB: 03-03-1971, 50 y.o.   MRN: [id number]  HPI Here for a well exam. She is doing well overall, but she does mention some constipation recently. She takes a probiotic but it does not seem to help. She drinks plenty of water daily. She also recently saw an orthopedist for some Dupuytrens contractures of the left hand. They decided to simply monitor these for now because they do not bother her much. She has been on estrogen replacement for several years, but she has developed some discomfort during intercourse. She has been using estrogen vaginal cream, but this is not helping. Her GYN has referred her for pelvic floor PT for this. 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 She has no idea what she might want to do.    Review of Systems  Constitutional: Negative.   HENT: Negative.    Eyes: Negative.   Respiratory: Negative.    Cardiovascular: Negative.   Gastrointestinal: Negative.   Genitourinary:  Negative for decreased urine volume, difficulty urinating, dyspareunia, dysuria, enuresis, flank pain, frequency, hematuria, pelvic pain and urgency.  Musculoskeletal: Negative.   Skin: Negative.   Neurological: Negative.  Negative for headaches.  Psychiatric/Behavioral: Negative.        Objective:   Physical Exam Constitutional:      General: She is not in acute distress.    Appearance: Normal appearance. She is well-developed.  HENT:     Head: Normocephalic and atraumatic.     Right Ear: External ear normal.     Left Ear: External ear normal.     Nose: Nose normal.     Mouth/Throat:     Pharynx: No oropharyngeal exudate.  Eyes:     General: No scleral icterus.    Conjunctiva/sclera: Conjunctivae normal.     Pupils: Pupils are equal, round, and reactive to light.  Neck:     Thyroid: No thyromegaly.     Vascular: No JVD.   Cardiovascular:     Rate and Rhythm: Normal rate and regular rhythm.     Heart sounds: Normal heart sounds. No murmur heard.   No friction rub. No gallop.  Pulmonary:     Effort: Pulmonary effort is normal. No respiratory distress.     Breath sounds: Normal breath sounds. No wheezing or rales.  Chest:     Chest wall: No tenderness.  Abdominal:     General: Bowel sounds are normal. There is no distension.     Palpations: Abdomen is soft. There is no mass.     Tenderness: There is no abdominal tenderness. There is no guarding or rebound.  Musculoskeletal:        General: No tenderness. Normal range of motion.     Cervical back: Normal range of motion and neck supple.  Lymphadenopathy:     Cervical: No cervical adenopathy.  Skin:    General: Skin is warm and dry.     Findings: No erythema or rash.  Neurological:     Mental Status: She is alert and oriented to person, place, and time.     Cranial Nerves: No cranial nerve deficit.     Motor: No abnormal muscle tone.     Coordination: Coordination normal.     Deep Tendon Reflexes: Reflexes are normal and symmetric. Reflexes normal.  Psychiatric:  Behavior: Behavior normal.        Thought Content: Thought content normal.        Judgment: Judgment normal.          Assessment & Plan:  Well exam. We discussed diet and exercise. Get fasting labs. For the constipation, I suggested she use Miralax daily.  Gershon Crane, MD

## 2021-01-07 ENCOUNTER — Encounter: Payer: Self-pay | Admitting: Physical Therapy

## 2021-01-07 ENCOUNTER — Other Ambulatory Visit: Payer: Self-pay

## 2021-01-07 ENCOUNTER — Ambulatory Visit: Payer: BC Managed Care – PPO | Attending: Nurse Practitioner | Admitting: Physical Therapy

## 2021-01-07 DIAGNOSIS — R278 Other lack of coordination: Secondary | ICD-10-CM | POA: Insufficient documentation

## 2021-01-07 DIAGNOSIS — M6281 Muscle weakness (generalized): Secondary | ICD-10-CM | POA: Diagnosis not present

## 2021-01-07 DIAGNOSIS — R252 Cramp and spasm: Secondary | ICD-10-CM | POA: Diagnosis not present

## 2021-01-07 NOTE — Patient Instructions (Signed)

## 2021-01-08 NOTE — Therapy (Addendum)
[facility] [address] [place], [place], [address] Phone: [phone]   Fax:  [phone]  Physical Therapy Evaluation  Patient Details  Name: BEE MARCHIANO MRN: [id number] Date of Birth: 1970-08-07 Referring Provider (PT): Tamela Gammon, NP   Encounter Date: 01/07/2021   PT End of Session - 01/08/21 1547     Visit Number 1    Date for PT Re-Evaluation 04/01/21    Authorization Type BCBS    PT Start Time 1448    PT Stop Time 1525    PT Time Calculation (min) 37 min    Activity Tolerance Patient tolerated treatment well    Behavior During Therapy [facility] for tasks assessed/performed             Past Medical History:  Diagnosis Date   Allergy    Anxiety    Ectopic pregnancy    LEFT CORNUA ECTOPIC PREGNANCY   GERD (gastroesophageal reflux disease)    Migraines     Past Surgical History:  Procedure Laterality Date   AUGMENTATION MAMMAPLASTY  2003   BUNIONECTOMY  2013   right foot, per Dr. Sharol Given    COLONOSCOPY  02/03/2019   per Dr. Silverio Decamp, no polyps, int hem, repeat in 10 yrs    ENDOMETRIAL ABLATION  2008   VAPORTRODE   RHINOPLASTY     VAGINAL HYSTERECTOMY  06/26/08   [place]. BILAT SALPINECTOMY    There were no vitals filed for this visit.    Subjective Assessment - 01/07/21 1457     Subjective Pt states she had BMs every 2-3 days prior to two months ago.  Probioic helped and before than was going little pebbles.  It has gotten softer. Pt states having sex is too painful and feels like the skin tears    Patient Stated Goals having easier BM and be able to have intercourse without    Currently in Pain? No/denies                [facility] PT Assessment - 01/08/21 0001       Assessment   Medical Diagnosis N81.6 (ICD-10-CM) - Rectocele; N81.89 (ICD-10-CM) - Pelvic floor relaxation    Referring Provider (PT) Tamela Gammon, NP    Prior Therapy No      Precautions    Precautions None      Balance Screen   Has the patient fallen in the past 6 months No      [place] residence    Living Arrangements Spouse/significant other;Children   son who is in college     Prior Function   Level of Independence Independent    Vocation Part time employment    Dietitian    Leisure bikes, walk, pilates      Cognition   Overall Cognitive Status Within Functional Limits for tasks assessed      Posture/Postural Control   Posture/Postural Control No significant limitations      AROM   Overall AROM Comments 80% lumbar flexion      PROM   Overall PROM Comments WNL      Strength   Overall Strength Comments Rt hip abduction 4/5      Flexibility   Soft Tissue Assessment /Muscle Length yes    Hamstrings normal but Rt is tight for her normal      Palpation   Palpation comment tension in lumbar and gluteals but maintains flexilbility  Objective measurements completed on examination: See above findings.     Pelvic Floor Special Questions - 01/08/21 0001     Are you Pregnant or attempting pregnancy? Yes    Number of Pregnancies 2    Number of Vaginal Deliveries 2    Any difficulty with labor and deliveries No   tearing   Currently Sexually Active Yes    Is this Painful Yes    Marinoff Scale pain prevents any attempts at intercourse    Urinary Leakage Yes    Pad use No    Activities that cause leaking --   only very rarely sneezes   Urinary urgency No    Urinary frequency normal    Fecal incontinence --   BM 2/week but not emptying   Fluid intake hot tea AM then water all day - 5 20 oz glasses    Falling out feeling (prolapse) Yes    Pelvic Floor Internal Exam pt identity confirmed and internal assessment performed    Exam Type Vaginal    Palpation high tone levators and coccygeus    Strength weak squeeze, no lift    Strength # of reps --   3 quick due  to slow reaction time   Strength # of seconds 5    Tone high                        PT Short Term Goals - 01/08/21 1703       PT SHORT TERM GOAL #1   Title ind with toileting    Time 4    Period Weeks    Status New    Target Date 02/04/21               PT Long Term Goals - 01/08/21 1703       PT LONG TERM GOAL #1   Title Pt will be ind with advanced HEP    Time 12    Period Weeks    Status New    Target Date 04/01/21      PT LONG TERM GOAL #2   Title Pt will be ind with techniques to stretch and relax pelvic floor for 0/3 Marinoff score    Baseline 3/3    Time 12    Period Weeks    Status New    Target Date 04/01/21      PT LONG TERM GOAL #3   Title Pt will be able to have a BM without straining on a consistent basis    Time 12    Period Weeks    Status Unable to assess    Target Date 04/01/21                    Plan - 01/08/21 1554     Clinical Impression Statement Pt presents to skilled PT due to dyspareunia and constipation.  Pt has weakness and high tone of the pelvic floor as mentioned in objective.  MMT is 2/5 and holding for 5 seconds.  She can only do 3 quick flicks in 10 sec due to slower reaction time.  Pt has tension in lumbar and gluteals. She has normal flexibiltiy but maybe tight for her.  Pt has some hip weakness as mentioned above.  Pt will benefit from skilled PT to address impairments and provide education so she can return to intercourse without being disrupted by pain and able to have BMs without straining and placing her at  risk for worsening of prolapse.    Personal Factors and Comorbidities Comorbidity 2    Comorbidities menoause and 2 vaginal deliveries 1 with significant tearing    Examination-Activity Limitations Toileting    Examination-Participation Restrictions Interpersonal Relationship    Stability/Clinical Decision Making Stable/Uncomplicated    Clinical Decision Making Low    Rehab Potential Excellent     PT Frequency 1x / week    PT Duration 12 weeks    PT Treatment/Interventions ADLs/Self Care Home Management;Biofeedback;Cryotherapy;Electrical Stimulation;Therapeutic activities;Therapeutic exercise;Neuromuscular re-education;Patient/family education;Manual techniques;Passive range of motion;Dry needling;Taping    PT Next Visit Plan f/u on toileting; breathing and bulging; abdominal massage, isolate pelvic floor and biofeedback possiblly    PT Home Exercise Plan toileting    Consulted and Agree with Plan of Care Patient             Patient will benefit from skilled therapeutic intervention in order to improve the following deficits and impairments:  Decreased strength, Decreased coordination, Impaired flexibility, Increased fascial restricitons, Pain  Visit Diagnosis: Cramp and spasm  Other lack of coordination  Muscle weakness (generalized)     Problem List Patient Active Problem List   Diagnosis Date Noted   Dupuytren's disease of palm of left hand 01/01/2021   Depression with anxiety 08/26/2007   INSOMNIA 08/26/2007   HEADACHE 08/26/2007    Camillo Flaming Lissandro Dilorenzo, PT 01/08/2021, 5:07 PM  [facility] [address] [place], [place], [address] Phone: [phone]   Fax:  [phone]  Name: ROSMARIE ESQUIBEL MRN: [id number] Date of Birth: 25-Oct-1970  PHYSICAL THERAPY DISCHARGE SUMMARY  Visits from Start of Care: 1  Current functional level related to goals / functional outcomes:   See above goals, only attended eval Remaining deficits: See above   Education / Equipment: HEP   Patient agrees to discharge. Patient goals were not met. Patient is being discharged due to the patient's request.  Gustavus Bryant, PT 01/20/21 10:08 AM

## 2021-01-30 ENCOUNTER — Encounter: Payer: BC Managed Care – PPO | Admitting: Physical Therapy

## 2021-02-05 ENCOUNTER — Encounter: Payer: BC Managed Care – PPO | Admitting: Physical Therapy

## 2021-02-27 ENCOUNTER — Encounter: Payer: BC Managed Care – PPO | Admitting: Physical Therapy

## 2021-03-03 ENCOUNTER — Encounter: Payer: BC Managed Care – PPO | Admitting: Physical Therapy

## 2021-05-29 ENCOUNTER — Encounter: Payer: Self-pay | Admitting: Family Medicine

## 2021-05-30 MED ORDER — TEMAZEPAM 15 MG PO CAPS: 15.0000 mg | ORAL_CAPSULE | Freq: Every day | ORAL | 1 refills | Status: DC

## 2021-05-30 NOTE — Telephone Encounter (Signed)
I sent in a new RX for one capsule per night

## 2021-06-03 ENCOUNTER — Other Ambulatory Visit: Payer: Self-pay | Admitting: Family Medicine

## 2021-06-03 DIAGNOSIS — Z1231 Encounter for screening mammogram for malignant neoplasm of breast: Secondary | ICD-10-CM

## 2021-06-05 DIAGNOSIS — L820 Inflamed seborrheic keratosis: Secondary | ICD-10-CM | POA: Diagnosis not present

## 2021-06-05 DIAGNOSIS — L821 Other seborrheic keratosis: Secondary | ICD-10-CM | POA: Diagnosis not present

## 2021-06-05 DIAGNOSIS — D225 Melanocytic nevi of trunk: Secondary | ICD-10-CM | POA: Diagnosis not present

## 2021-06-20 ENCOUNTER — Ambulatory Visit
Admission: RE | Admit: 2021-06-20 | Discharge: 2021-06-20 | Disposition: A | Discharge status: Home / Self Care | Payer: BC Managed Care – PPO | Source: Ambulatory Visit | Attending: Family Medicine | Admitting: Family Medicine

## 2021-06-20 DIAGNOSIS — Z1231 Encounter for screening mammogram for malignant neoplasm of breast: Secondary | ICD-10-CM | POA: Diagnosis not present

## 2021-07-03 ENCOUNTER — Encounter: Payer: Self-pay | Admitting: Family Medicine

## 2021-07-07 ENCOUNTER — Ambulatory Visit (INDEPENDENT_AMBULATORY_CARE_PROVIDER_SITE_OTHER): Payer: BC Managed Care – PPO | Admitting: Family Medicine

## 2021-07-07 ENCOUNTER — Encounter: Payer: Self-pay | Admitting: Family Medicine

## 2021-07-07 VITALS — BP 108/80 | HR 67 | Temp 98.1°F | Ht 65.0 in | Wt 112.0 lb

## 2021-07-07 DIAGNOSIS — M5431 Sciatica, right side: Secondary | ICD-10-CM

## 2021-07-07 DIAGNOSIS — M7711 Lateral epicondylitis, right elbow: Secondary | ICD-10-CM | POA: Diagnosis not present

## 2021-07-07 MED ORDER — DICLOFENAC SODIUM 75 MG PO TBEC: 75.0000 mg | DELAYED_RELEASE_TABLET | Freq: Two times a day (BID) | ORAL | 2 refills | Status: DC

## 2021-07-07 NOTE — Progress Notes (Signed)
° °  Subjective:    Patient ID: Linda [name], female    DOB: 07/31/1970, 51 y.o.   MRN: [id number]  HPI Here for 2 different issues. First about 3 months ago she was carrying a Christmas decoration down steps from the attic and she felt a sudden sharp pain in the right lower back. This pain also radiates through the  right buttock and down the right leg. She also has numbness on the right lateral foot. These symptoms have not improved at all since then. She applies heat to the back and takes Ibuprofen. Also about 2 months ago she was digging in her yard to plant flowers when she noticed a pain in the right elbow. This pain has continued ever since. She is also using heat and Ibuprofen for this.    Review of Systems  Constitutional: Negative.   Respiratory: Negative.    Cardiovascular: Negative.   Musculoskeletal:  Positive for arthralgias and back pain.  Neurological:  Positive for numbness. Negative for weakness.      Objective:   Physical Exam Constitutional:      General: She is not in acute distress.    Appearance: Normal appearance.  Cardiovascular:     Rate and Rhythm: Normal rate and regular rhythm.     Pulses: Normal pulses.     Heart sounds: Normal heart sounds.  Pulmonary:     Effort: Pulmonary effort is normal.     Breath sounds: Normal breath sounds.  Musculoskeletal:     Comments: She is not tender over the spine and the spine has full ROM. She is tender however over the right sciatic notch and palpation of this area causes pain to shoot down the right leg. Also she is tender over the right elbow at the lateral epicondyle. No swelling and ROM is full   Neurological:     Mental Status: She is alert.          Assessment & Plan:  She has right lateral epicondylitis, and she will apply ice instead of heat TID. Rest it and apply Voltaren 1% gel QID as needed. She will also take Diclofenac 75 mg BID until this heals. In addition she has right sided sciatica, and we  will set up an MRI of the lumbar spine to evaluate this. The Diclofenac should also help with this pain.  Gershon Crane, MD

## 2021-07-12 ENCOUNTER — Other Ambulatory Visit: Payer: Self-pay

## 2021-07-12 ENCOUNTER — Ambulatory Visit
Admission: RE | Admit: 2021-07-12 | Discharge: 2021-07-12 | Disposition: A | Discharge status: Home / Self Care | Payer: BC Managed Care – PPO | Source: Ambulatory Visit | Attending: Family Medicine | Admitting: Family Medicine

## 2021-07-12 DIAGNOSIS — M5431 Sciatica, right side: Secondary | ICD-10-CM

## 2021-07-12 DIAGNOSIS — M5127 Other intervertebral disc displacement, lumbosacral region: Secondary | ICD-10-CM | POA: Diagnosis not present

## 2021-07-15 ENCOUNTER — Encounter: Payer: Self-pay | Admitting: Family Medicine

## 2021-07-16 MED ORDER — TEMAZEPAM 15 MG PO CAPS: ORAL_CAPSULE | ORAL | 1 refills | Status: DC

## 2021-07-16 NOTE — Addendum Note (Signed)
Addended by: Gershon Crane A on: 07/16/2021 03:08 PM   Modules accepted: Orders

## 2021-07-16 NOTE — Telephone Encounter (Signed)
That's a hard problem to solve. I've never tried this before, but I will send in a new RX for Temazepam that says "take one at dinner and one at bedtime" (even though I know she will take both of them together at bedtime). Let's see if they will cover it that way  ?

## 2021-07-19 ENCOUNTER — Other Ambulatory Visit: Payer: BC Managed Care – PPO

## 2021-08-26 DIAGNOSIS — M5417 Radiculopathy, lumbosacral region: Secondary | ICD-10-CM | POA: Diagnosis not present

## 2021-08-26 DIAGNOSIS — Z681 Body mass index (BMI) 19 or less, adult: Secondary | ICD-10-CM | POA: Diagnosis not present

## 2021-08-26 DIAGNOSIS — M5127 Other intervertebral disc displacement, lumbosacral region: Secondary | ICD-10-CM | POA: Diagnosis not present

## 2021-08-27 ENCOUNTER — Other Ambulatory Visit: Payer: Self-pay | Admitting: Nurse Practitioner

## 2021-08-27 DIAGNOSIS — Z79890 Hormone replacement therapy (postmenopausal): Secondary | ICD-10-CM

## 2021-08-28 NOTE — Telephone Encounter (Signed)
Pharmacy attached note "Pharmacy comment: REQUEST FOR 90 DAYS PRESCRIPTION. DX Code Needed." ? ?Last annual exam was 11/2020 ? ?Just FYI  ?

## 2021-09-27 IMAGING — US US BREAST*L* LIMITED INC AXILLA
1 series · 5 of 5 positions shown · non-contrast
Comparison: Previous exam(s).

CLINICAL DATA: Screening recall for possible left breast asymmetry.

EXAM:
DIGITAL DIAGNOSTIC UNILATERAL LEFT MAMMOGRAM WITH CAD AND TOMO
LEFT BREAST ULTRASOUND

[Series 1: us breast*left* limited inc axilla · 0.05mm/px · 5 of 5 slices shown]
[im 1/5]
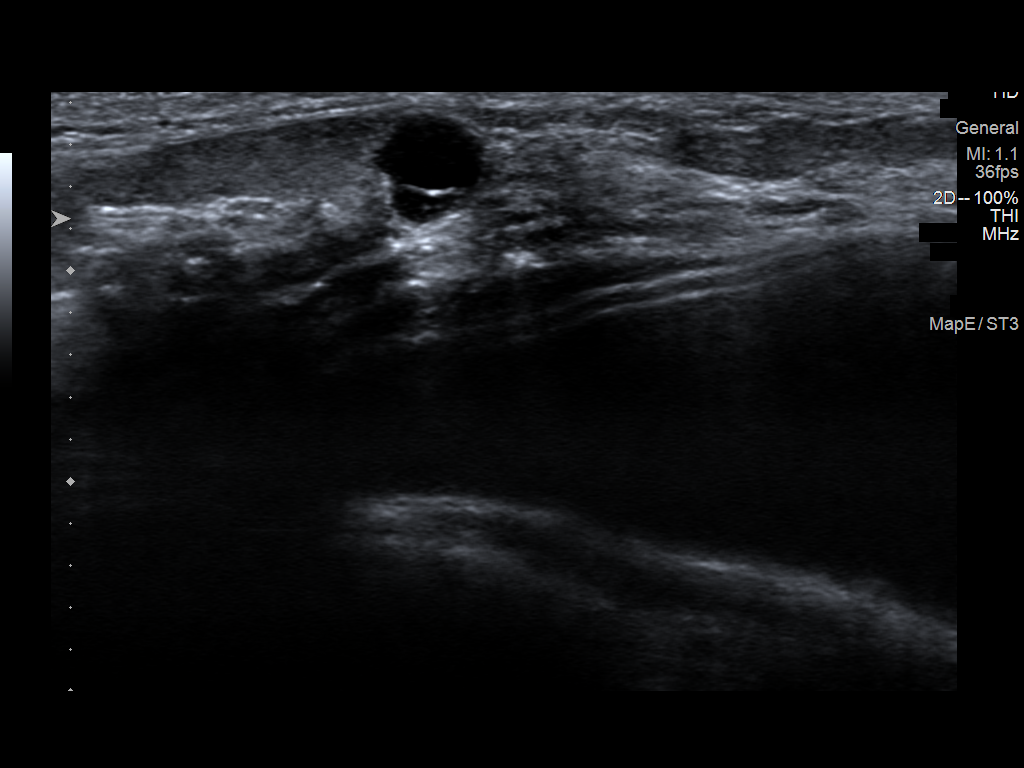
[im 2/5]
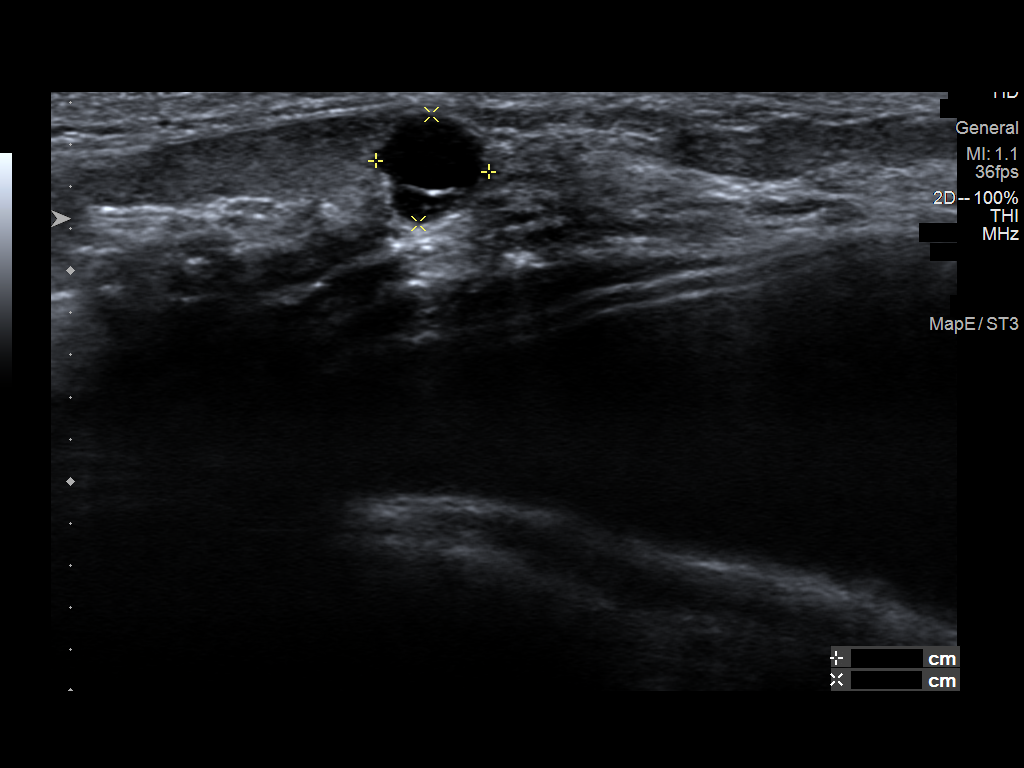
[im 3/5]
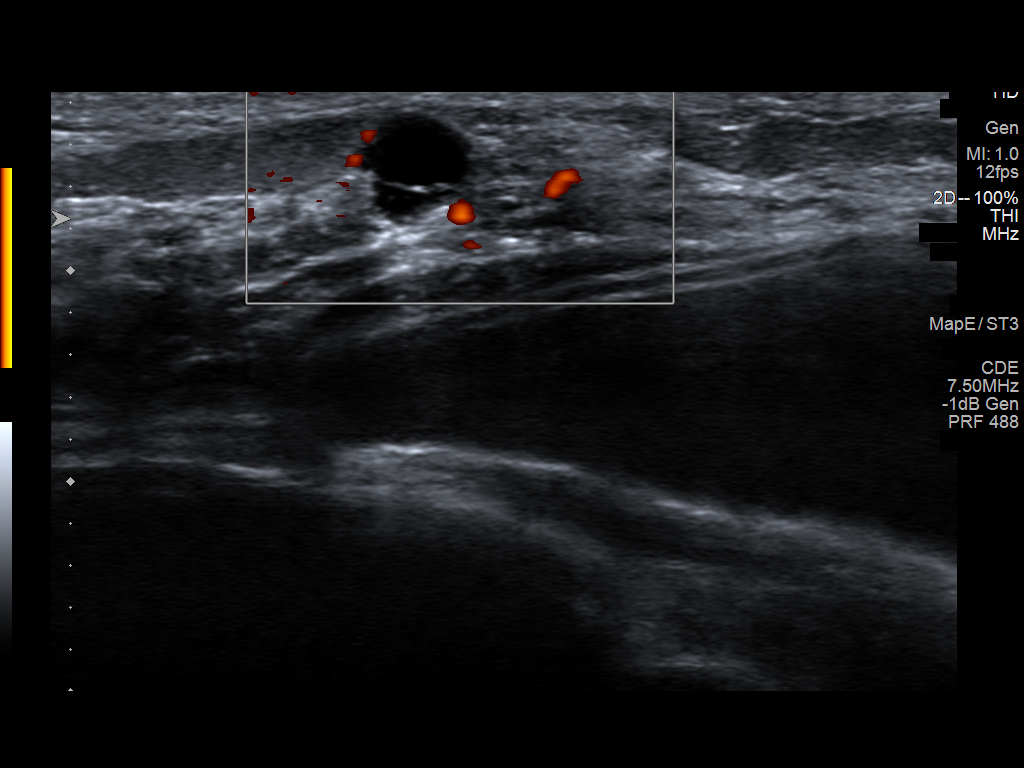
[im 4/5]
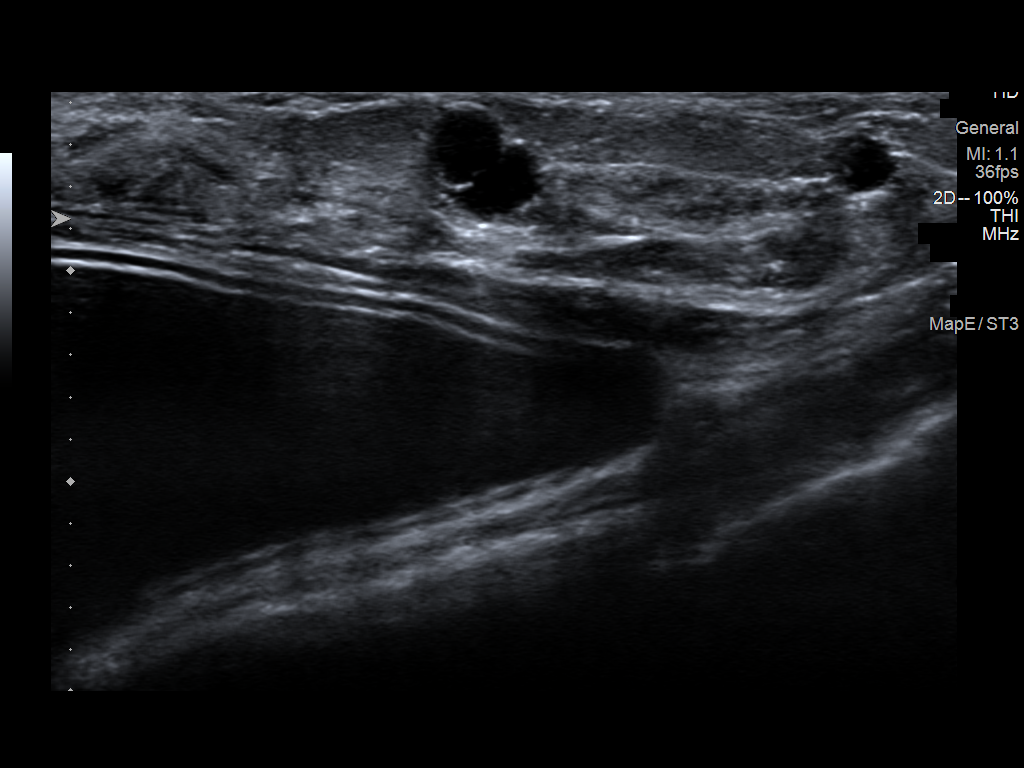
[im 5/5]
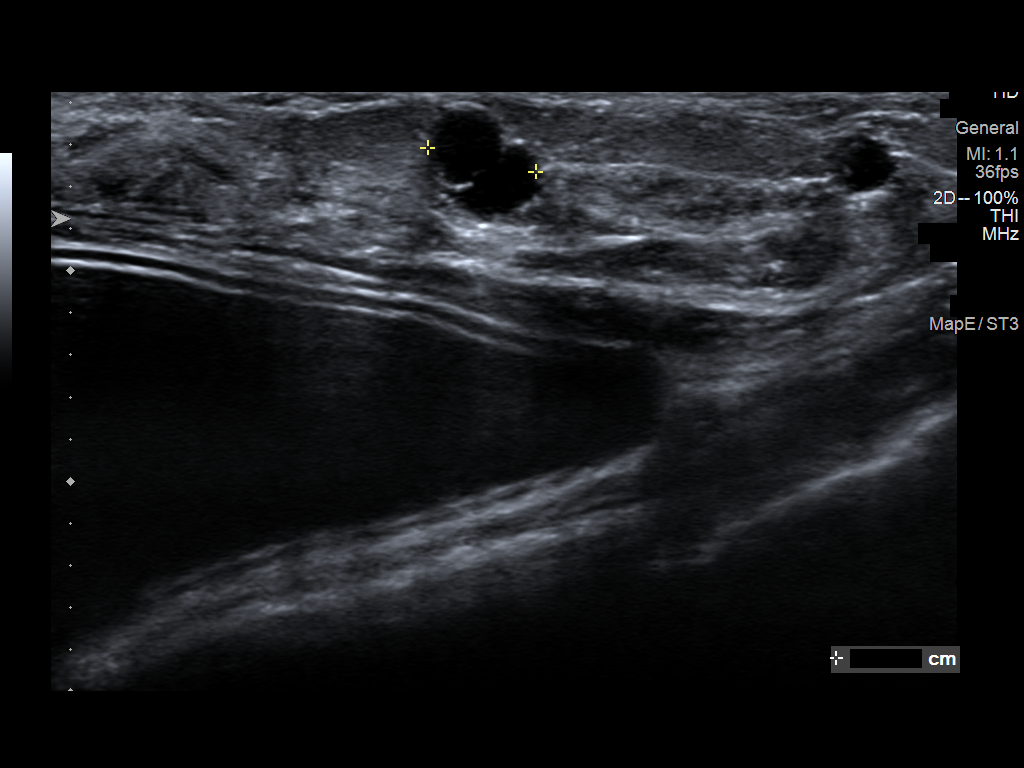

[5 of 5 positions shown; findings below may reference images not displayed]

ACR Breast Density Category d: The breast tissue is extremely dense,
which lowers the sensitivity of mammography.
FINDINGS: Additional tomograms were performed of the left breast. There is an
oval circumscribed mass in the lower central left breast measuring
approximately 0.5 cm.

Mammographic images were processed with CAD.

Targeted ultrasound of the left breast was performed. There is a
septated cyst at 6 o'clock 2 cm from nipple measuring 0.5 x 0.5 x
0.5 cm. This corresponds well with the mass seen in the left breast
at mammography. Additional small scattered cysts are seen within the
lower central left breast.
IMPRESSION: Left breast cyst. There are no findings of malignancy in the left
breast.

RECOMMENDATION:
Screening mammogram in one year.(Code:17-X-TP8)

I have discussed the findings and recommendations with the patient.
If applicable, a reminder letter will be sent to the patient
regarding the next appointment.

BI-RADS CATEGORY  2: Benign.

## 2021-09-27 IMAGING — MG MM DIGITAL DIAGNOSTIC UNILAT*L* W/ TOMO W/ CAD
4 series · 4 of 12 positions shown · non-contrast
Comparison: Previous exam(s).

CLINICAL DATA: Screening recall for possible left breast asymmetry.

EXAM:
DIGITAL DIAGNOSTIC UNILATERAL LEFT MAMMOGRAM WITH CAD AND TOMO
LEFT BREAST ULTRASOUND

[L CC synth-2D]
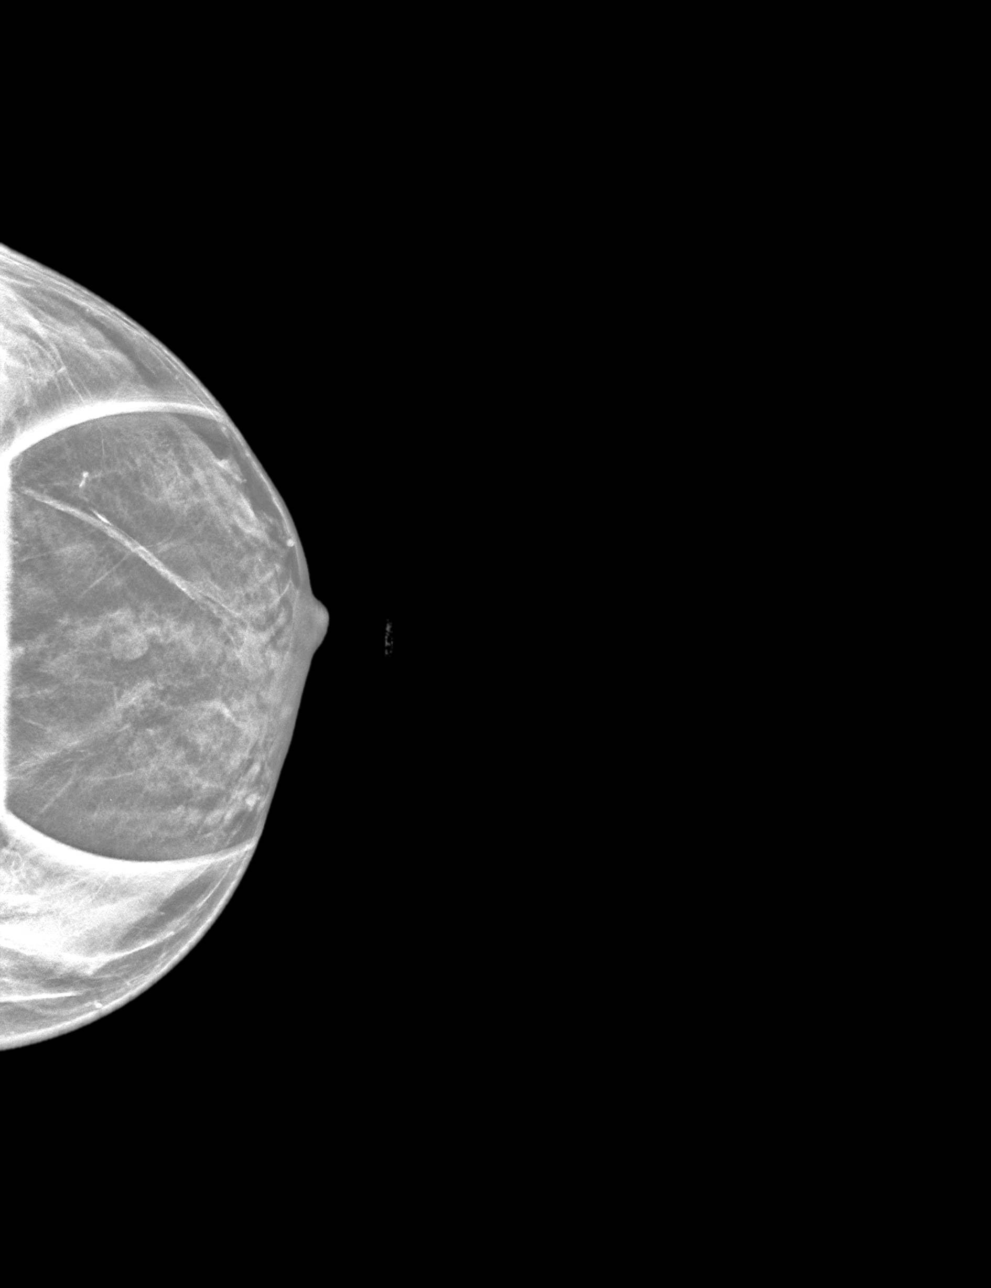

[L ML synth-2D]
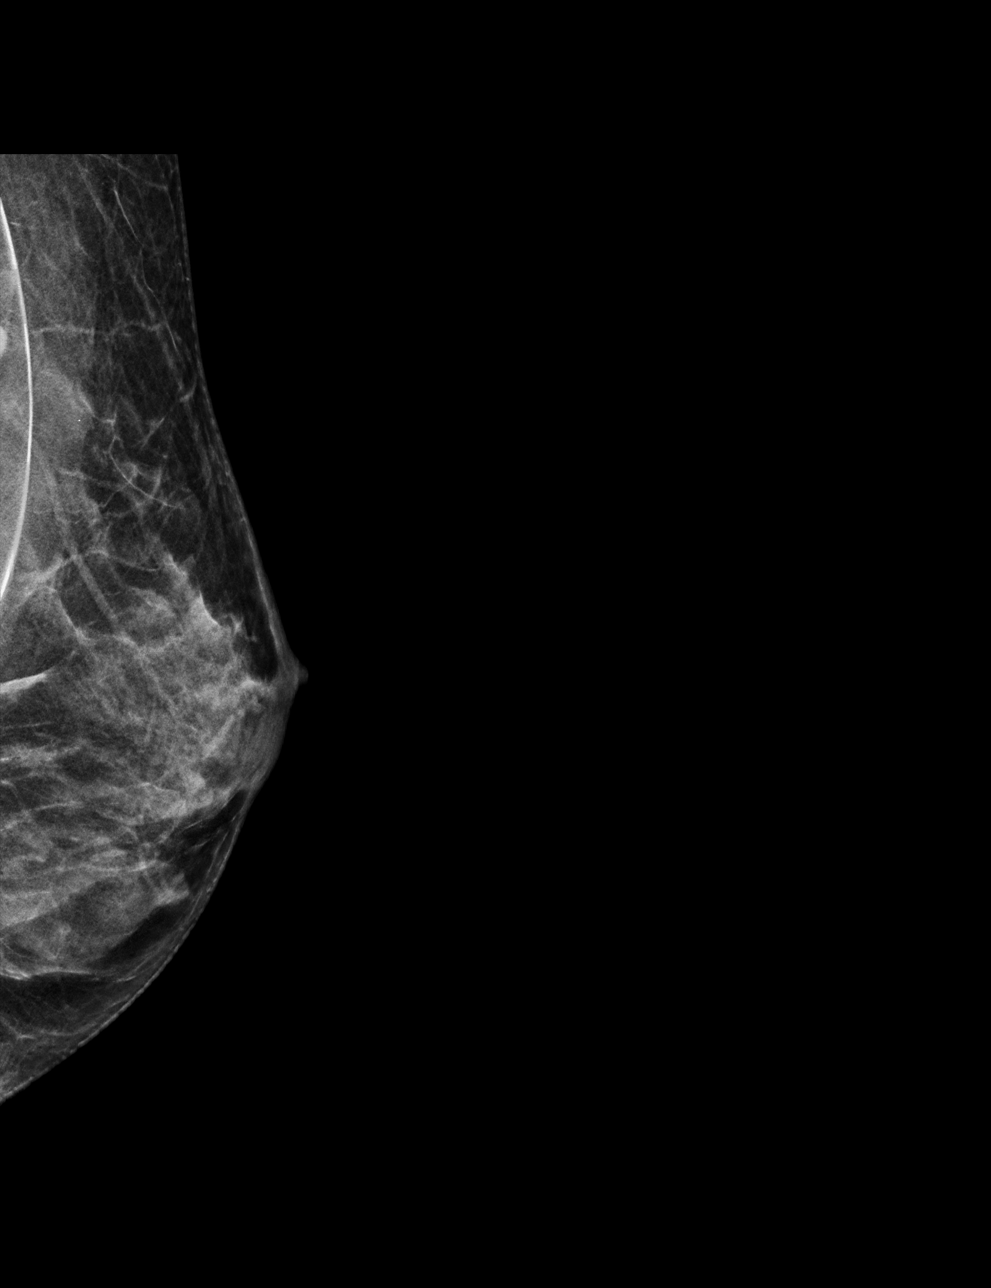

[L ML tomo · tomo slice 19/38.0]
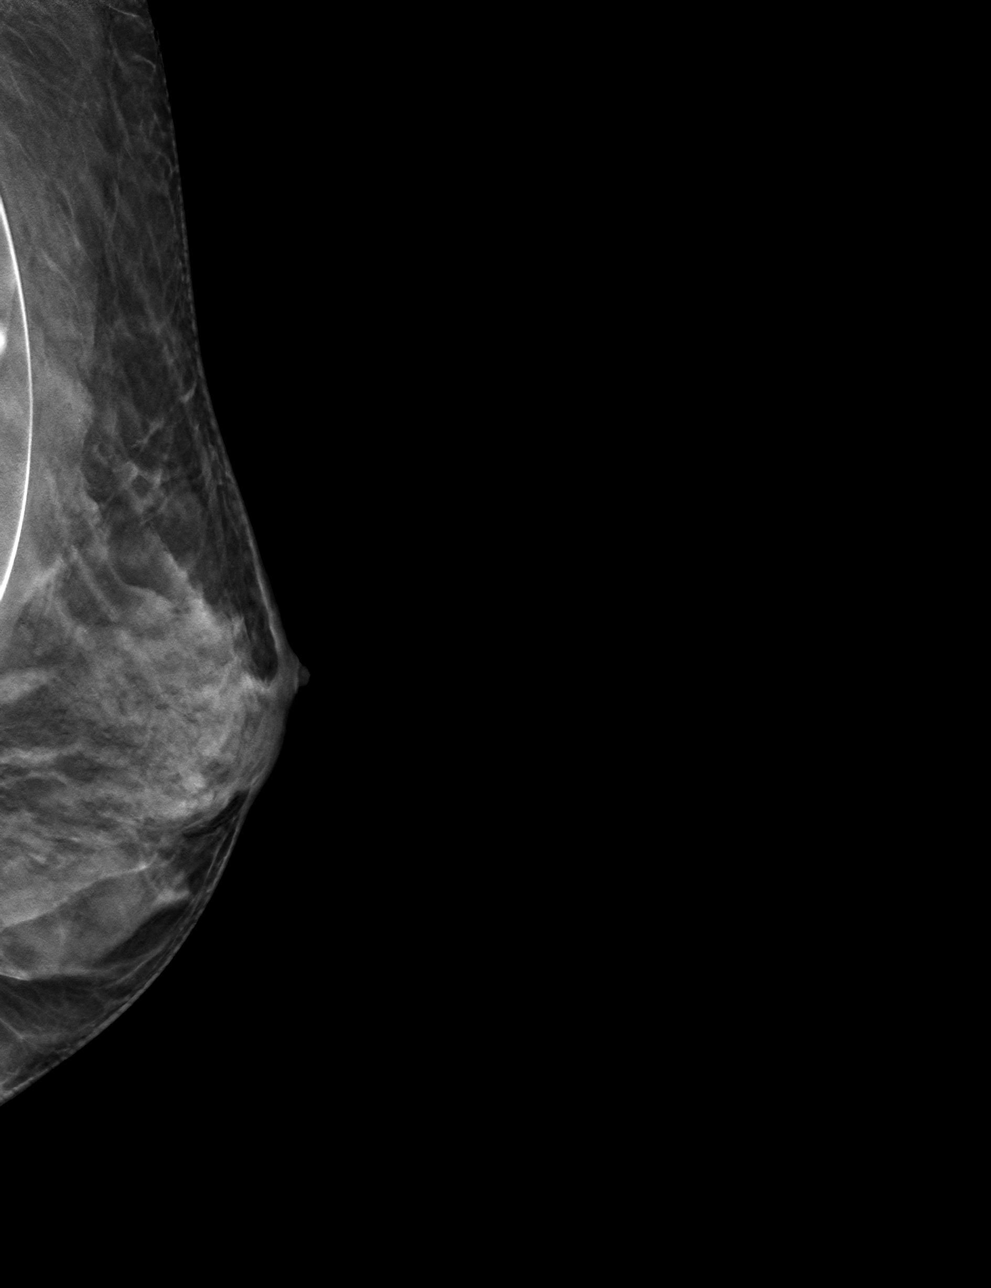

[L CC tomo · tomo slice 14/27.0]
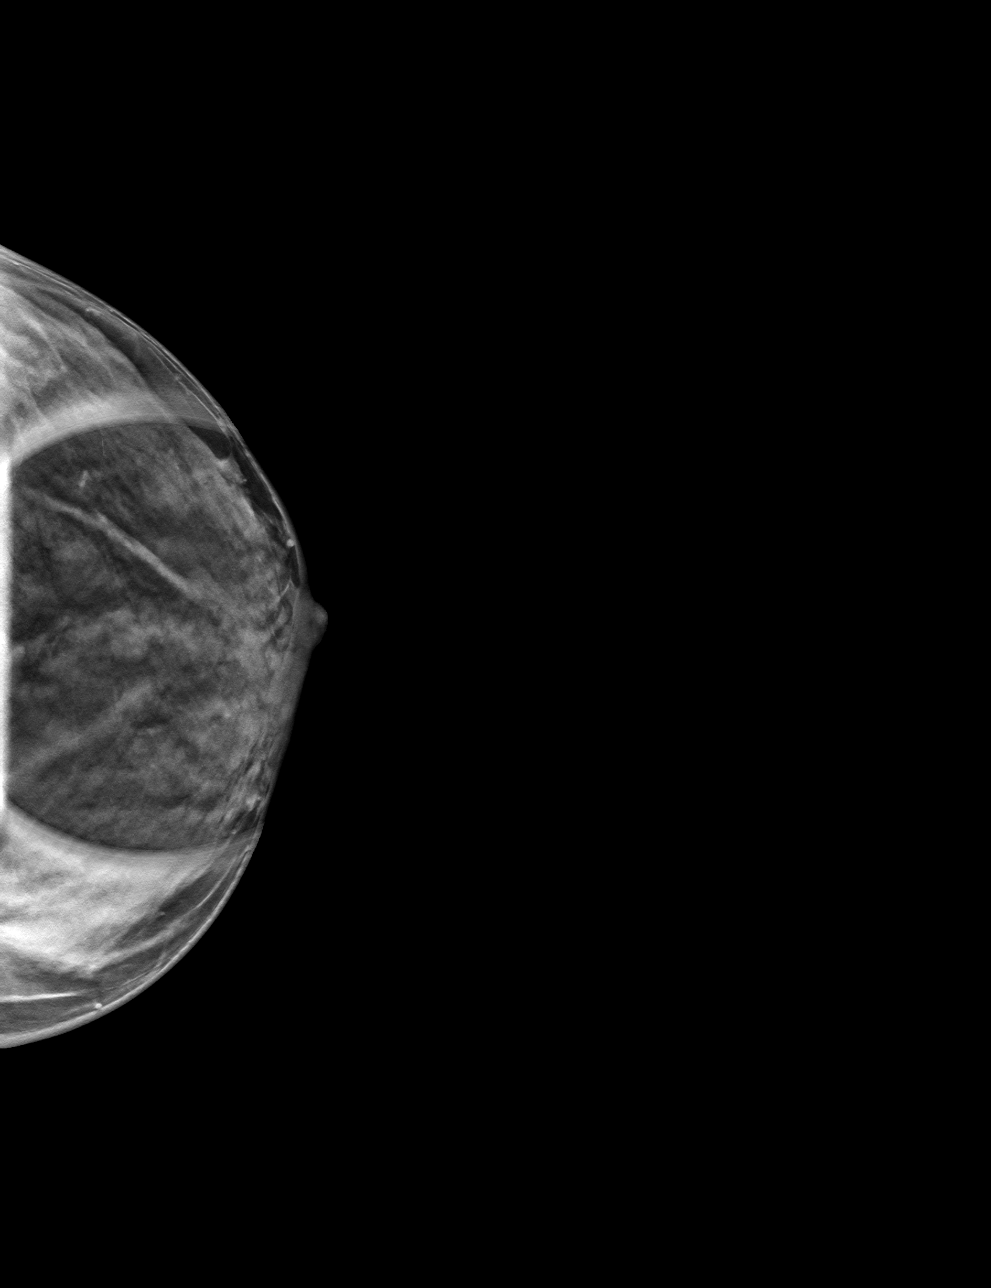

[4 of 12 positions shown; findings below may reference images not displayed]

ACR Breast Density Category d: The breast tissue is extremely dense,
which lowers the sensitivity of mammography.
FINDINGS: Additional tomograms were performed of the left breast. There is an
oval circumscribed mass in the lower central left breast measuring
approximately 0.5 cm.

Mammographic images were processed with CAD.

Targeted ultrasound of the left breast was performed. There is a
septated cyst at 6 o'clock 2 cm from nipple measuring 0.5 x 0.5 x
0.5 cm. This corresponds well with the mass seen in the left breast
at mammography. Additional small scattered cysts are seen within the
lower central left breast.
IMPRESSION: Left breast cyst. There are no findings of malignancy in the left
breast.

RECOMMENDATION:
Screening mammogram in one year.(Code:17-X-TP8)

I have discussed the findings and recommendations with the patient.
If applicable, a reminder letter will be sent to the patient
regarding the next appointment.

BI-RADS CATEGORY  2: Benign.

## 2021-10-09 HISTORY — PX: BACK SURGERY: SHX140

## 2021-10-15 DIAGNOSIS — M5117 Intervertebral disc disorders with radiculopathy, lumbosacral region: Secondary | ICD-10-CM | POA: Diagnosis not present

## 2021-10-15 HISTORY — PX: BACK SURGERY: SHX140

## 2021-10-29 ENCOUNTER — Other Ambulatory Visit: Payer: Self-pay | Admitting: Family Medicine

## 2021-10-29 NOTE — Telephone Encounter (Signed)
Last OV- 07/07/21 Last refill- 07/07/21--60 tabs, 2 refills  Per last OV 07/07/21  patient to take for epicondylitis until heals.      Can this patient receive a refill?

## 2021-11-18 ENCOUNTER — Other Ambulatory Visit: Payer: Self-pay | Admitting: Nurse Practitioner

## 2021-11-18 DIAGNOSIS — Z79890 Hormone replacement therapy (postmenopausal): Secondary | ICD-10-CM

## 2021-11-18 NOTE — Telephone Encounter (Signed)
Last annual exam was 11/2020 No future exam scheduled

## 2021-12-03 ENCOUNTER — Ambulatory Visit (INDEPENDENT_AMBULATORY_CARE_PROVIDER_SITE_OTHER): Payer: BC Managed Care – PPO | Admitting: Nurse Practitioner

## 2021-12-03 ENCOUNTER — Encounter: Payer: Self-pay | Admitting: Nurse Practitioner

## 2021-12-03 VITALS — BP 112/78 | HR 69 | Ht 65.25 in | Wt 113.0 lb

## 2021-12-03 DIAGNOSIS — Z01419 Encounter for gynecological examination (general) (routine) without abnormal findings: Secondary | ICD-10-CM | POA: Diagnosis not present

## 2021-12-03 DIAGNOSIS — M8589 Other specified disorders of bone density and structure, multiple sites: Secondary | ICD-10-CM | POA: Diagnosis not present

## 2021-12-03 DIAGNOSIS — Z79890 Hormone replacement therapy (postmenopausal): Secondary | ICD-10-CM

## 2021-12-03 NOTE — Progress Notes (Signed)
Linda [name] [birth year] [id number]   History:  51 y.o. [id number] presents for annual exam. Postmenopausal - started ERT last year due significant menopausal symptoms. Was using vaginal estrogen prior but does not feel she needs it anymore. Complains of brain fog. S/P 2010 TVH due to ectopic pregnancy. Normal pap and mammogram history. Herniated disk surgery 10/2021, doing well and ready to get back to exercising. Went to one pelvic floor PT session but was not comfortable continuing. She has had some improvement in constipation.   Gynecologic History No LMP recorded. Patient has had a hysterectomy.   Contraception: status post hysterectomy Sexually active: Yes  Health maintenance Last Pap: 11/22/2009. Results were: Normal Last mammogram: 06/20/2021. Results were: Normal Last colonoscopy: 01/14/2019. Results were: Normal, 10-year recall Last Dexa: 03/09/2019. Results were: T-score -1.6 FRAX 4.1% / 0.4%  Past medical history, past surgical history, family history and social history were all reviewed and documented in the EPIC chart. Married. 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 Daughter at [employer] for finance, son graduated from [employer] in business last December.   ROS:  A ROS was performed and pertinent positives and negatives are included.  Exam:  Vitals:   12/03/21 1136  BP: 112/78  Pulse: 69  SpO2: 98%  Weight: 113 lb (51.3 kg)  Height: 5' 5.25" (1.657 m)     Body mass index is 18.66 kg/m.  General appearance:  Normal Thyroid:  Symmetrical, normal in size, without palpable masses or nodularity. Respiratory  Auscultation:  Clear without wheezing or rhonchi Cardiovascular  Auscultation:  Regular rate, without rubs, murmurs or gallops  Edema/varicosities:  Not grossly evident Abdominal  Soft,nontender, without masses, guarding or rebound.  Liver/spleen:  No organomegaly noted  Hernia:  None appreciated  Skin  Inspection:  Grossly normal   Breasts:  Examined lying and sitting. Bilateral implants noted  Right: Without masses, retractions, discharge or axillary adenopathy.   Left: Without masses, retractions, discharge or axillary adenopathy. Gentitourinary   Inguinal/mons:  Normal without inguinal adenopathy  External genitalia:  Normal  BUS/Urethra/Skene's glands:  Normal  Vagina:  Normal, atrophic changes, rectocele and vaginal prolapse  Cervix:  Absent  Uterus:  Absent  Anus and perineum: Normal  Digital rectal exam: Normal sphincter tone without palpated masses or tenderness  Patient informed chaperone available to be present for breast and pelvic exam. Patient has requested no chaperone to be present. Patient has been advised what will be completed during breast and pelvic exam.   Assessment/Plan:  51 y.o. [id number] for annual exam.   Well female exam with routine gynecological exam - Education provided on SBEs, importance of preventative screenings, current guidelines, high calcium diet, regular exercise, and multivitamin daily. Labs with PCP.   Hormone replacement therapy - Estradiol 0.05 mg patch twice weekly. Aware of the risks for blood clots, heart attack, stroke, and breast cancer, as well as the benefits of heart and bone health and symptom relief. Good management but experiencing brain fog. Recommend neuro mag daily. No longer using vaginal estrogen.   Osteopenia of multiple sites - Plan: DG Bone Density. T-score -1.6 FRAX 4.1% / 0.4% in October 2020. Continue high calcium diet, vitamin d supplement, and regular exercise. Recommend repeating DXA now.   Screening for cervical cancer - Normal Pap history.  No longer screening per guidelines.   Screening for breast cancer - Normal mammogram history.  Continue annual screenings.  Normal breast exam today.  Screening for colon cancer - 2020 colonoscopy. Will repeat at  GI's recommended interval.  Follow up in 1 year for annual.     [facility], 11:50 AM  12/03/2021

## 2021-12-19 ENCOUNTER — Encounter: Payer: Self-pay | Admitting: Nurse Practitioner

## 2021-12-19 ENCOUNTER — Encounter: Payer: Self-pay | Admitting: Family Medicine

## 2021-12-19 DIAGNOSIS — M8589 Other specified disorders of bone density and structure, multiple sites: Secondary | ICD-10-CM

## 2021-12-19 NOTE — Telephone Encounter (Signed)
Please reach out to patient to schedule dexa. TW placed the order.

## 2022-01-14 ENCOUNTER — Ambulatory Visit (INDEPENDENT_AMBULATORY_CARE_PROVIDER_SITE_OTHER): Payer: BC Managed Care – PPO

## 2022-01-14 ENCOUNTER — Other Ambulatory Visit: Payer: Self-pay | Admitting: Nurse Practitioner

## 2022-01-14 DIAGNOSIS — M8589 Other specified disorders of bone density and structure, multiple sites: Secondary | ICD-10-CM

## 2022-01-14 DIAGNOSIS — Z13820 Encounter for screening for osteoporosis: Secondary | ICD-10-CM | POA: Diagnosis not present

## 2022-01-14 DIAGNOSIS — Z780 Asymptomatic menopausal state: Secondary | ICD-10-CM

## 2022-01-24 ENCOUNTER — Other Ambulatory Visit: Payer: Self-pay | Admitting: Nurse Practitioner

## 2022-01-24 DIAGNOSIS — Z79890 Hormone replacement therapy (postmenopausal): Secondary | ICD-10-CM

## 2022-01-25 ENCOUNTER — Encounter: Payer: Self-pay | Admitting: Family Medicine

## 2022-01-25 ENCOUNTER — Other Ambulatory Visit: Payer: Self-pay | Admitting: Family Medicine

## 2022-01-26 MED ORDER — TEMAZEPAM 15 MG PO CAPS: ORAL_CAPSULE | ORAL | 1 refills | Status: DC

## 2022-01-26 NOTE — Telephone Encounter (Signed)
Med refill request: Vivelle-Dot patch 0.05 mg twice weekly Last AEX: 12/03/21/ TW Next AEX: Not scheduled Last MMG (if hormonal med) 06/20/21 -Neg Refill authorized: Please Advise?  Last Rx sent 11/18/21 #24/0RF

## 2022-01-26 NOTE — Telephone Encounter (Signed)
I sent this in just like before

## 2022-04-21 ENCOUNTER — Encounter: Payer: Self-pay | Admitting: Family Medicine

## 2022-04-21 ENCOUNTER — Ambulatory Visit (INDEPENDENT_AMBULATORY_CARE_PROVIDER_SITE_OTHER): Payer: BC Managed Care – PPO | Admitting: Family Medicine

## 2022-04-21 VITALS — BP 110/70 | HR 66 | Temp 97.9°F | Ht 65.25 in | Wt 117.0 lb

## 2022-04-21 DIAGNOSIS — Z0000 Encounter for general adult medical examination without abnormal findings: Secondary | ICD-10-CM | POA: Diagnosis not present

## 2022-04-21 DIAGNOSIS — Z23 Encounter for immunization: Secondary | ICD-10-CM

## 2022-04-21 MED ORDER — LEXAPRO 20 MG PO TABS: 20.0000 mg | ORAL_TABLET | Freq: Every day | ORAL | 3 refills | Status: DC

## 2022-04-21 NOTE — Addendum Note (Signed)
Addended by: Carola Rhine on: 04/21/2022 04:48 PM   Modules accepted: Orders

## 2022-04-21 NOTE — Progress Notes (Signed)
   Subjective:    Patient ID: Linda [name], female    DOB: Apr 16, 1971, 51 y.o.   MRN: [id number]  HPI Here for a well exam. She feels great. She had a lumbar discectomy in June, and this total y alleviated her back pain.    Review of Systems  Constitutional: Negative.   HENT: Negative.    Eyes: Negative.   Respiratory: Negative.    Cardiovascular: Negative.   Gastrointestinal: Negative.   Genitourinary:  Negative for decreased urine volume, difficulty urinating, dyspareunia, dysuria, enuresis, flank pain, frequency, hematuria, pelvic pain and urgency.  Musculoskeletal: Negative.   Skin: Negative.   Neurological: Negative.  Negative for headaches.  Psychiatric/Behavioral: Negative.         Objective:   Physical Exam Constitutional:      General: She is not in acute distress.    Appearance: Normal appearance. She is well-developed.  HENT:     Head: Normocephalic and atraumatic.     Right Ear: External ear normal.     Left Ear: External ear normal.     Nose: Nose normal.     Mouth/Throat:     Pharynx: No oropharyngeal exudate.  Eyes:     General: No scleral icterus.    Conjunctiva/sclera: Conjunctivae normal.     Pupils: Pupils are equal, round, and reactive to light.  Neck:     Thyroid: No thyromegaly.     Vascular: No JVD.  Cardiovascular:     Rate and Rhythm: Normal rate and regular rhythm.     Heart sounds: Normal heart sounds. No murmur heard.    No friction rub. No gallop.  Pulmonary:     Effort: Pulmonary effort is normal. No respiratory distress.     Breath sounds: Normal breath sounds. No wheezing or rales.  Chest:     Chest wall: No tenderness.  Abdominal:     General: Bowel sounds are normal. There is no distension.     Palpations: Abdomen is soft. There is no mass.     Tenderness: There is no abdominal tenderness. There is no guarding or rebound.  Musculoskeletal:        General: No tenderness. Normal range of motion.     Cervical back: Normal  range of motion and neck supple.  Lymphadenopathy:     Cervical: No cervical adenopathy.  Skin:    General: Skin is warm and dry.     Findings: No erythema or rash.  Neurological:     Mental Status: She is alert and oriented to person, place, and time.     Cranial Nerves: No cranial nerve deficit.     Motor: No abnormal muscle tone.     Coordination: Coordination normal.     Deep Tendon Reflexes: Reflexes are normal and symmetric. Reflexes normal.  Psychiatric:        Behavior: Behavior normal.        Thought Content: Thought content normal.        Judgment: Judgment normal.           Assessment & Plan:  Well exam. We discussed diet and exercise. Get fasting labs. Gershon Crane, MD

## 2022-04-29 ENCOUNTER — Other Ambulatory Visit (INDEPENDENT_AMBULATORY_CARE_PROVIDER_SITE_OTHER): Payer: BC Managed Care – PPO

## 2022-04-29 DIAGNOSIS — Z0000 Encounter for general adult medical examination without abnormal findings: Secondary | ICD-10-CM | POA: Diagnosis not present

## 2022-04-29 LAB — BASIC METABOLIC PANEL
BUN: 11 mg/dL (ref 6–23)
CO2: 30 mEq/L (ref 19–32)
Calcium: 9.7 mg/dL (ref 8.4–10.5)
Chloride: 102 mEq/L (ref 96–112)
Creatinine, Ser: 0.77 mg/dL (ref 0.40–1.20)
GFR: 88.11 mL/min (ref >60.00)
Glucose, Bld: 92 mg/dL (ref 70–99)
Potassium: 3.9 mEq/L (ref 3.5–5.1)
Sodium: 138 mEq/L (ref 135–145)

## 2022-04-29 LAB — HEPATIC FUNCTION PANEL
ALT: 10 U/L (ref 0–35)
AST: 15 U/L (ref 0–37)
Albumin: 4.6 g/dL (ref 3.5–5.2)
Alkaline Phosphatase: 53 U/L (ref 39–117)
Bilirubin, Direct: 0.1 mg/dL (ref 0.0–0.3)
Total Bilirubin: 0.9 mg/dL (ref 0.2–1.2)
Total Protein: 7 g/dL (ref 6.0–8.3)

## 2022-04-29 LAB — CBC WITH DIFFERENTIAL/PLATELET
Basophils Absolute: 0 10*3/uL (ref 0.0–0.1)
Basophils Relative: 0.6 % (ref 0.0–3.0)
Eosinophils Absolute: 0.1 10*3/uL (ref 0.0–0.7)
Eosinophils Relative: 2.4 % (ref 0.0–5.0)
HCT: 41.4 % (ref 36.0–46.0)
Hemoglobin: 14.4 g/dL (ref 12.0–15.0)
Lymphocytes Relative: 31 % (ref 12.0–46.0)
Lymphs Abs: 1.6 10*3/uL (ref 0.7–4.0)
MCHC: 34.8 g/dL (ref 30.0–36.0)
MCV: 87.1 fl (ref 78.0–100.0)
Monocytes Absolute: 0.3 10*3/uL (ref 0.1–1.0)
Monocytes Relative: 6.4 % (ref 3.0–12.0)
Neutro Abs: 3.1 10*3/uL (ref 1.4–7.7)
Neutrophils Relative %: 59.6 % (ref 43.0–77.0)
Platelets: 227 10*3/uL (ref 150.0–400.0)
RBC: 4.75 Mil/uL (ref 3.87–5.11)
RDW: 13.3 % (ref 11.5–15.5)
WBC: 5.1 10*3/uL (ref 4.0–10.5)

## 2022-04-29 LAB — LIPID PANEL
Cholesterol: 192 mg/dL (ref 0–200)
HDL: 61.6 mg/dL (ref >39.00)
LDL Cholesterol: 119 mg/dL — ABNORMAL HIGH (ref 0–99)
NonHDL: 130.11
Total CHOL/HDL Ratio: 3
Triglycerides: 58 mg/dL (ref 0.0–149.0)
VLDL: 11.6 mg/dL (ref 0.0–40.0)

## 2022-04-29 LAB — HEMOGLOBIN A1C: Hgb A1c MFr Bld: 5.2 % (ref 4.6–6.5)

## 2022-04-29 LAB — TSH: TSH: 0.81 u[IU]/mL (ref 0.35–5.50)

## 2022-05-14 DIAGNOSIS — Z23 Encounter for immunization: Secondary | ICD-10-CM | POA: Diagnosis not present

## 2022-06-08 DIAGNOSIS — L821 Other seborrheic keratosis: Secondary | ICD-10-CM | POA: Diagnosis not present

## 2022-06-08 DIAGNOSIS — D225 Melanocytic nevi of trunk: Secondary | ICD-10-CM | POA: Diagnosis not present

## 2022-07-02 ENCOUNTER — Other Ambulatory Visit: Payer: Self-pay | Admitting: Family Medicine

## 2022-07-02 DIAGNOSIS — Z1231 Encounter for screening mammogram for malignant neoplasm of breast: Secondary | ICD-10-CM

## 2022-07-23 ENCOUNTER — Other Ambulatory Visit: Payer: Self-pay | Admitting: Family Medicine

## 2022-07-23 ENCOUNTER — Encounter: Payer: Self-pay | Admitting: Family Medicine

## 2022-07-23 NOTE — Telephone Encounter (Signed)
Last OV CPE-04/21/22 Last refill-01/26/22-180 capsules, 1 refills  No future OV scheduled.

## 2022-07-24 MED ORDER — TEMAZEPAM 15 MG PO CAPS: ORAL_CAPSULE | ORAL | 1 refills | Status: DC

## 2022-07-24 NOTE — Telephone Encounter (Signed)
I did the refill  

## 2022-08-20 ENCOUNTER — Ambulatory Visit
Admission: RE | Admit: 2022-08-20 | Discharge: 2022-08-20 | Disposition: A | Discharge status: Home / Self Care | Payer: BC Managed Care – PPO | Source: Ambulatory Visit | Attending: Family Medicine | Admitting: Family Medicine

## 2022-08-20 DIAGNOSIS — Z1231 Encounter for screening mammogram for malignant neoplasm of breast: Secondary | ICD-10-CM

## 2022-08-24 ENCOUNTER — Other Ambulatory Visit: Payer: Self-pay | Admitting: Family Medicine

## 2022-08-24 DIAGNOSIS — R928 Other abnormal and inconclusive findings on diagnostic imaging of breast: Secondary | ICD-10-CM

## 2022-09-01 ENCOUNTER — Encounter: Payer: Self-pay | Admitting: Family Medicine

## 2022-09-03 ENCOUNTER — Ambulatory Visit
Admission: RE | Admit: 2022-09-03 | Discharge: 2022-09-03 | Disposition: A | Discharge status: Home / Self Care | Payer: BC Managed Care – PPO | Source: Ambulatory Visit | Attending: Family Medicine | Admitting: Family Medicine

## 2022-09-03 DIAGNOSIS — R928 Other abnormal and inconclusive findings on diagnostic imaging of breast: Secondary | ICD-10-CM

## 2022-09-03 DIAGNOSIS — N6012 Diffuse cystic mastopathy of left breast: Secondary | ICD-10-CM | POA: Diagnosis not present

## 2022-09-03 DIAGNOSIS — R92332 Mammographic heterogeneous density, left breast: Secondary | ICD-10-CM | POA: Diagnosis not present

## 2022-09-04 ENCOUNTER — Other Ambulatory Visit: Payer: Self-pay | Admitting: Family Medicine

## 2022-09-04 DIAGNOSIS — N6002 Solitary cyst of left breast: Secondary | ICD-10-CM

## 2022-11-30 DIAGNOSIS — H35361 Drusen (degenerative) of macula, right eye: Secondary | ICD-10-CM | POA: Diagnosis not present

## 2023-01-18 ENCOUNTER — Other Ambulatory Visit: Payer: Self-pay | Admitting: Family Medicine

## 2023-01-20 ENCOUNTER — Encounter: Payer: Self-pay | Admitting: Family Medicine

## 2023-01-20 NOTE — Telephone Encounter (Signed)
Pt LOV was on 04/21/22 Last refill was done on 07/24/22 Please advise

## 2023-02-03 DIAGNOSIS — Z79890 Hormone replacement therapy (postmenopausal): Secondary | ICD-10-CM | POA: Diagnosis not present

## 2023-02-03 DIAGNOSIS — Z139 Encounter for screening, unspecified: Secondary | ICD-10-CM | POA: Diagnosis not present

## 2023-02-03 DIAGNOSIS — M8580 Other specified disorders of bone density and structure, unspecified site: Secondary | ICD-10-CM | POA: Diagnosis not present

## 2023-02-03 DIAGNOSIS — Z01419 Encounter for gynecological examination (general) (routine) without abnormal findings: Secondary | ICD-10-CM | POA: Diagnosis not present

## 2023-03-09 ENCOUNTER — Other Ambulatory Visit: Payer: Self-pay | Admitting: Family Medicine

## 2023-03-09 ENCOUNTER — Ambulatory Visit
Admission: RE | Admit: 2023-03-09 | Discharge: 2023-03-09 | Disposition: A | Discharge status: Home / Self Care | Payer: BC Managed Care – PPO | Source: Ambulatory Visit | Attending: Family Medicine | Admitting: Family Medicine

## 2023-03-09 DIAGNOSIS — N6002 Solitary cyst of left breast: Secondary | ICD-10-CM | POA: Diagnosis not present

## 2023-03-16 DIAGNOSIS — J3089 Other allergic rhinitis: Secondary | ICD-10-CM | POA: Diagnosis not present

## 2023-03-16 DIAGNOSIS — H1045 Other chronic allergic conjunctivitis: Secondary | ICD-10-CM | POA: Diagnosis not present

## 2023-03-18 DIAGNOSIS — N898 Other specified noninflammatory disorders of vagina: Secondary | ICD-10-CM | POA: Diagnosis not present

## 2023-03-18 DIAGNOSIS — R8290 Unspecified abnormal findings in urine: Secondary | ICD-10-CM | POA: Diagnosis not present

## 2023-03-18 DIAGNOSIS — R102 Pelvic and perineal pain: Secondary | ICD-10-CM | POA: Diagnosis not present

## 2023-03-18 DIAGNOSIS — R140 Abdominal distension (gaseous): Secondary | ICD-10-CM | POA: Diagnosis not present

## 2023-03-23 DIAGNOSIS — R102 Pelvic and perineal pain: Secondary | ICD-10-CM | POA: Diagnosis not present

## 2023-03-23 DIAGNOSIS — R140 Abdominal distension (gaseous): Secondary | ICD-10-CM | POA: Diagnosis not present

## 2023-03-23 DIAGNOSIS — E559 Vitamin D deficiency, unspecified: Secondary | ICD-10-CM | POA: Diagnosis not present

## 2023-04-15 ENCOUNTER — Encounter: Payer: Self-pay | Admitting: Family Medicine

## 2023-04-15 ENCOUNTER — Ambulatory Visit: Payer: BC Managed Care – PPO | Admitting: Family Medicine

## 2023-04-15 VITALS — BP 100/72 | HR 70 | Temp 98.3°F | Wt 113.6 lb

## 2023-04-15 DIAGNOSIS — R1011 Right upper quadrant pain: Secondary | ICD-10-CM | POA: Diagnosis not present

## 2023-04-15 LAB — CBC WITH DIFFERENTIAL/PLATELET
Basophils Absolute: 0 10*3/uL (ref 0.0–0.1)
Basophils Relative: 0.4 % (ref 0.0–3.0)
Eosinophils Absolute: 0.1 10*3/uL (ref 0.0–0.7)
Eosinophils Relative: 1.4 % (ref 0.0–5.0)
HCT: 41.8 % (ref 36.0–46.0)
Hemoglobin: 14.3 g/dL (ref 12.0–15.0)
Lymphocytes Relative: 30.1 % (ref 12.0–46.0)
Lymphs Abs: 1.6 10*3/uL (ref 0.7–4.0)
MCHC: 34.1 g/dL (ref 30.0–36.0)
MCV: 88.2 fL (ref 78.0–100.0)
Monocytes Absolute: 0.4 10*3/uL (ref 0.1–1.0)
Monocytes Relative: 7.3 % (ref 3.0–12.0)
Neutro Abs: 3.2 10*3/uL (ref 1.4–7.7)
Neutrophils Relative %: 60.8 % (ref 43.0–77.0)
Platelets: 196 10*3/uL (ref 150.0–400.0)
RBC: 4.74 Mil/uL (ref 3.87–5.11)
RDW: 13.1 % (ref 11.5–15.5)
WBC: 5.2 10*3/uL (ref 4.0–10.5)

## 2023-04-15 LAB — BASIC METABOLIC PANEL
BUN: 11 mg/dL (ref 6–23)
CO2: 29 meq/L (ref 19–32)
Calcium: 9.3 mg/dL (ref 8.4–10.5)
Chloride: 102 meq/L (ref 96–112)
Creatinine, Ser: 0.7 mg/dL (ref 0.40–1.20)
GFR: 98.12 mL/min (ref >60.00)
Glucose, Bld: 95 mg/dL (ref 70–99)
Potassium: 4 meq/L (ref 3.5–5.1)
Sodium: 137 meq/L (ref 135–145)

## 2023-04-15 LAB — URINALYSIS
Bilirubin Urine: NEGATIVE
Hgb urine dipstick: NEGATIVE
Ketones, ur: NEGATIVE
Leukocytes,Ua: NEGATIVE
Nitrite: NEGATIVE
Specific Gravity, Urine: 1.01 (ref 1.000–1.030)
Total Protein, Urine: NEGATIVE
Urine Glucose: NEGATIVE
Urobilinogen, UA: 0.2 (ref 0.0–1.0)
pH: 7.5 (ref 5.0–8.0)

## 2023-04-15 LAB — HEPATIC FUNCTION PANEL
ALT: 16 U/L (ref 0–35)
AST: 18 U/L (ref 0–37)
Albumin: 4.8 g/dL (ref 3.5–5.2)
Alkaline Phosphatase: 48 U/L (ref 39–117)
Bilirubin, Direct: 0.2 mg/dL (ref 0.0–0.3)
Total Bilirubin: 1 mg/dL (ref 0.2–1.2)
Total Protein: 7.3 g/dL (ref 6.0–8.3)

## 2023-04-15 MED ORDER — LEXAPRO 20 MG PO TABS: 20.0000 mg | ORAL_TABLET | Freq: Every day | ORAL | 3 refills | Status: DC

## 2023-04-15 NOTE — Progress Notes (Signed)
   Subjective:    Patient ID: Linda [name], female    DOB: Jul 28, 1970, 52 y.o.   MRN: [id number]  HPI Here for 5 weeks of intermittent abdominal bloating, belching and passing gas, RUQ pains, and nausea. No fever. Her BM's have been stable. She has some urgency to urinate but no burning. No vaginal DC. She saw her GYN on 03-23-23, and they did a transvaginal pelvic US. This showed her ovaries to be normal. She says the RUQ pain usually starts 15-20 minutes after eating a meal. She tried taking Pepcid OTC, but this did not help.    Review of Systems  Constitutional: Negative.   Respiratory: Negative.    Cardiovascular: Negative.   Gastrointestinal:  Positive for abdominal pain and nausea. Negative for abdominal distention, blood in stool, constipation, diarrhea, rectal pain and vomiting.  Genitourinary:  Positive for urgency. Negative for dysuria, frequency and hematuria.       Objective:   Physical Exam Constitutional:      Appearance: Normal appearance. She is not ill-appearing.  Cardiovascular:     Rate and Rhythm: Normal rate and regular rhythm.     Pulses: Normal pulses.     Heart sounds: Normal heart sounds.  Pulmonary:     Effort: Pulmonary effort is normal.     Breath sounds: Normal breath sounds.  Abdominal:     General: Abdomen is flat. Bowel sounds are normal. There is no distension.     Palpations: Abdomen is soft. There is no mass.     Tenderness: There is no right CVA tenderness, guarding or rebound.     Hernia: No hernia is present.     Comments: Mildly tender in the RUQ  Neurological:     Mental Status: She is alert.           Assessment & Plan:  RUQ pain and nausea consistent with gall bladder disease. She will avoid fatty foods. Check labs today. Set up an abdominal US.  Gershon Crane, MD

## 2023-04-16 ENCOUNTER — Ambulatory Visit
Admission: RE | Admit: 2023-04-16 | Discharge: 2023-04-16 | Disposition: A | Discharge status: Home / Self Care | Payer: BC Managed Care – PPO | Source: Ambulatory Visit | Attending: Family Medicine | Admitting: Family Medicine

## 2023-04-16 DIAGNOSIS — R1011 Right upper quadrant pain: Secondary | ICD-10-CM

## 2023-04-21 ENCOUNTER — Other Ambulatory Visit: Payer: Self-pay | Admitting: *Deleted

## 2023-04-21 MED ORDER — OMEPRAZOLE 40 MG PO CPDR: 40.0000 mg | DELAYED_RELEASE_CAPSULE | Freq: Every day | ORAL | 2 refills | Status: DC

## 2023-05-13 ENCOUNTER — Ambulatory Visit (INDEPENDENT_AMBULATORY_CARE_PROVIDER_SITE_OTHER): Payer: BC Managed Care – PPO | Admitting: Family Medicine

## 2023-05-13 ENCOUNTER — Encounter: Payer: Self-pay | Admitting: Family Medicine

## 2023-05-13 VITALS — BP 96/68 | HR 61 | Temp 98.0°F | Ht 65.25 in | Wt 113.0 lb

## 2023-05-13 DIAGNOSIS — K219 Gastro-esophageal reflux disease without esophagitis: Secondary | ICD-10-CM | POA: Diagnosis not present

## 2023-05-13 DIAGNOSIS — Z0000 Encounter for general adult medical examination without abnormal findings: Secondary | ICD-10-CM | POA: Diagnosis not present

## 2023-05-13 LAB — LIPID PANEL
Cholesterol: 193 mg/dL (ref 0–200)
HDL: 60.6 mg/dL (ref >39.00)
LDL Cholesterol: 120 mg/dL — ABNORMAL HIGH (ref 0–99)
NonHDL: 132.33
Total CHOL/HDL Ratio: 3
Triglycerides: 64 mg/dL (ref 0.0–149.0)
VLDL: 12.8 mg/dL (ref 0.0–40.0)

## 2023-05-13 LAB — TSH: TSH: 0.51 u[IU]/mL (ref 0.35–5.50)

## 2023-05-13 LAB — HEMOGLOBIN A1C: Hgb A1c MFr Bld: 5.4 % (ref 4.6–6.5)

## 2023-05-13 MED ORDER — ALPRAZOLAM 0.5 MG PO TABS: 0.5000 mg | ORAL_TABLET | Freq: Two times a day (BID) | ORAL | 2 refills | Status: DC | PRN

## 2023-05-13 MED ORDER — OMEPRAZOLE 40 MG PO CPDR: 40.0000 mg | DELAYED_RELEASE_CAPSULE | Freq: Every day | ORAL | 3 refills | Status: DC

## 2023-05-13 NOTE — Progress Notes (Signed)
 Subjective:    Patient ID: Linda [name], female    DOB: 1970-09-19, 53 y.o.   MRN: [id number]  HPI Here for a well exam. She has a few issues to discuss. We saw her a month ago for upper abdominal pain, and we let her try Omeprazole  40 mg every morning. She had some labs drawn and she had an abdominal US  ,and all these were normal. Since then her abdominal pain has almost totally resolved. She has adjusted her diet, and she now only has trouble if she eats something acidic like tomato sauce. She has been taking Lexapro  for the past 10 years, and she asks if she can stop this and switch to a medication that she can use as needed. She has already cut the Lexapro  down to 1/2 pill (10 mg) a day.    Review of Systems  Constitutional: Negative.   HENT: Negative.    Eyes: Negative.   Respiratory: Negative.    Cardiovascular: Negative.   Gastrointestinal: Negative.   Genitourinary:  Negative for decreased urine volume, difficulty urinating, dyspareunia, dysuria, enuresis, flank pain, frequency, hematuria, pelvic pain and urgency.  Musculoskeletal: Negative.   Skin: Negative.   Neurological: Negative.  Negative for headaches.  Psychiatric/Behavioral:  The patient is nervous/anxious.        Objective:   Physical Exam Constitutional:      General: She is not in acute distress.    Appearance: Normal appearance. She is well-developed.  HENT:     Head: Normocephalic and atraumatic.     Right Ear: External ear normal.     Left Ear: External ear normal.     Nose: Nose normal.     Mouth/Throat:     Pharynx: No oropharyngeal exudate.  Eyes:     General: No scleral icterus.    Conjunctiva/sclera: Conjunctivae normal.     Pupils: Pupils are equal, round, and reactive to light.  Neck:     Thyroid : No thyromegaly.     Vascular: No JVD.  Cardiovascular:     Rate and Rhythm: Normal rate and regular rhythm.     Pulses: Normal pulses.     Heart sounds: Normal heart sounds. No murmur  heard.    No friction rub. No gallop.  Pulmonary:     Effort: Pulmonary effort is normal. No respiratory distress.     Breath sounds: Normal breath sounds. No wheezing or rales.  Chest:     Chest wall: No tenderness.  Abdominal:     General: Bowel sounds are normal. There is no distension.     Palpations: Abdomen is soft. There is no mass.     Tenderness: There is no abdominal tenderness. There is no guarding or rebound.  Musculoskeletal:        General: No tenderness. Normal range of motion.     Cervical back: Normal range of motion and neck supple.  Lymphadenopathy:     Cervical: No cervical adenopathy.  Skin:    General: Skin is warm and dry.     Findings: No erythema or rash.  Neurological:     General: No focal deficit present.     Mental Status: She is alert and oriented to person, place, and time.     Cranial Nerves: No cranial nerve deficit.     Motor: No abnormal muscle tone.     Coordination: Coordination normal.     Deep Tendon Reflexes: Reflexes are normal and symmetric. Reflexes normal.  Psychiatric:  Mood and Affect: Mood normal.        Behavior: Behavior normal.        Thought Content: Thought content normal.        Judgment: Judgment normal.           Assessment & Plan:  Well exam. We discussed diet and exercise. Get fasting labs. Her anxiety has been under much better control, so she will taper off the Lexapro  by taking 1/2 tablet every other day for 2 weeks and then stopping it. Instead she will try Xanax  0.5 mg on an as needed basis. Her GERD is not well controlled.  Garnette Olmsted, MD

## 2023-05-17 ENCOUNTER — Encounter: Payer: Self-pay | Admitting: Family Medicine

## 2023-05-26 ENCOUNTER — Telehealth: Payer: Self-pay

## 2023-05-26 DIAGNOSIS — E559 Vitamin D deficiency, unspecified: Secondary | ICD-10-CM

## 2023-05-26 NOTE — Telephone Encounter (Signed)
 Copied from CRM [id number]. Topic: General - Other >> May 26, 2023  8:53 AM Kita Perish H wrote: Reason for CRM: Patient would like to know if she needs to fast before her lab appointment on January 22nd to have her vitamin D  level checked, please reach out to patient. Thank you  King [phone]

## 2023-05-31 NOTE — Telephone Encounter (Signed)
Spoke with pt advised that she does not need to fast for her Vit D level check

## 2023-05-31 NOTE — Addendum Note (Signed)
Addended by: Carola Rhine on: 05/31/2023 01:30 PM   Modules accepted: Orders

## 2023-06-01 DIAGNOSIS — N941 Unspecified dyspareunia: Secondary | ICD-10-CM | POA: Diagnosis not present

## 2023-06-01 DIAGNOSIS — N952 Postmenopausal atrophic vaginitis: Secondary | ICD-10-CM | POA: Diagnosis not present

## 2023-06-02 ENCOUNTER — Other Ambulatory Visit (INDEPENDENT_AMBULATORY_CARE_PROVIDER_SITE_OTHER): Payer: BC Managed Care – PPO

## 2023-06-02 DIAGNOSIS — E559 Vitamin D deficiency, unspecified: Secondary | ICD-10-CM | POA: Diagnosis not present

## 2023-06-02 LAB — VITAMIN D 25 HYDROXY (VIT D DEFICIENCY, FRACTURES): VITD: 33.99 ng/mL (ref 30.00–100.00)

## 2023-06-07 NOTE — Telephone Encounter (Signed)
Yes that would be 2000 units a day. That's perfect

## 2023-06-09 DIAGNOSIS — D2261 Melanocytic nevi of right upper limb, including shoulder: Secondary | ICD-10-CM | POA: Diagnosis not present

## 2023-06-09 DIAGNOSIS — D225 Melanocytic nevi of trunk: Secondary | ICD-10-CM | POA: Diagnosis not present

## 2023-06-09 DIAGNOSIS — L905 Scar conditions and fibrosis of skin: Secondary | ICD-10-CM | POA: Diagnosis not present

## 2023-06-09 DIAGNOSIS — L821 Other seborrheic keratosis: Secondary | ICD-10-CM | POA: Diagnosis not present

## 2023-06-09 DIAGNOSIS — D2262 Melanocytic nevi of left upper limb, including shoulder: Secondary | ICD-10-CM | POA: Diagnosis not present

## 2023-06-09 DIAGNOSIS — L820 Inflamed seborrheic keratosis: Secondary | ICD-10-CM | POA: Diagnosis not present

## 2023-07-18 ENCOUNTER — Other Ambulatory Visit: Payer: Self-pay | Admitting: Family Medicine

## 2023-07-20 NOTE — Telephone Encounter (Signed)
 Pt LOV was 05/13/23 Please advise

## 2023-08-04 DIAGNOSIS — H524 Presbyopia: Secondary | ICD-10-CM | POA: Diagnosis not present

## 2023-08-04 DIAGNOSIS — H04121 Dry eye syndrome of right lacrimal gland: Secondary | ICD-10-CM | POA: Diagnosis not present

## 2023-08-04 DIAGNOSIS — H5203 Hypermetropia, bilateral: Secondary | ICD-10-CM | POA: Diagnosis not present

## 2023-09-02 ENCOUNTER — Encounter: Payer: Self-pay | Admitting: Family Medicine

## 2023-09-08 ENCOUNTER — Ambulatory Visit
Admission: RE | Admit: 2023-09-08 | Discharge: 2023-09-08 | Disposition: A | Discharge status: Home / Self Care | Payer: BC Managed Care – PPO | Source: Ambulatory Visit | Attending: Family Medicine | Admitting: Family Medicine

## 2023-09-08 DIAGNOSIS — N6325 Unspecified lump in the left breast, overlapping quadrants: Secondary | ICD-10-CM | POA: Diagnosis not present

## 2023-09-08 DIAGNOSIS — N6002 Solitary cyst of left breast: Secondary | ICD-10-CM

## 2023-10-15 ENCOUNTER — Other Ambulatory Visit (HOSPITAL_BASED_OUTPATIENT_CLINIC_OR_DEPARTMENT_OTHER): Payer: Self-pay

## 2023-10-19 MED FILL — Temazepam Cap 15 MG: ORAL | 90 days supply | Qty: 180 | Fill #0 | Status: AC

## 2023-10-20 ENCOUNTER — Other Ambulatory Visit (HOSPITAL_BASED_OUTPATIENT_CLINIC_OR_DEPARTMENT_OTHER): Payer: Self-pay

## 2023-10-20 ENCOUNTER — Other Ambulatory Visit: Payer: Self-pay

## 2023-11-01 ENCOUNTER — Encounter: Payer: Self-pay | Admitting: Family Medicine

## 2023-11-01 ENCOUNTER — Other Ambulatory Visit (HOSPITAL_BASED_OUTPATIENT_CLINIC_OR_DEPARTMENT_OTHER): Payer: Self-pay

## 2023-11-01 ENCOUNTER — Telehealth (INDEPENDENT_AMBULATORY_CARE_PROVIDER_SITE_OTHER): Admitting: Family Medicine

## 2023-11-01 DIAGNOSIS — K219 Gastro-esophageal reflux disease without esophagitis: Secondary | ICD-10-CM | POA: Diagnosis not present

## 2023-11-01 MED ORDER — PANTOPRAZOLE SODIUM 40 MG PO TBEC
40.0000 mg | DELAYED_RELEASE_TABLET | Freq: Two times a day (BID) | ORAL | 5 refills | Status: DC
  Filled 2023-11-01: qty 60, 30d supply, fill #0
  Filled 2023-12-08: qty 60, 30d supply, fill #1

## 2023-11-01 NOTE — Progress Notes (Signed)
 Subjective:    Patient ID: Linda [name], female    DOB: 02-28-1971, 53 y.o.   MRN: [id number]  HPI Virtual Visit via Video Note  I connected with the patient on 11/01/23 at  2:45 PM EDT by a video enabled telemedicine application and verified that I am speaking with the correct person using two identifiers.  Location patient: home Location provider:work or home office Persons participating in the virtual visit: patient, provider  I discussed the limitations of evaluation and management by telemedicine and the availability of in person appointments. The patient expressed understanding and agreed to proceed.   HPI: Here for worsening GERD symptoms. She has been taking 40 mg Omeprazole  every morning since last year, but it does not work as well as it used to. Lately she begins to have heartburn in the middle to late afternoons, and she has to take Mylanta or TUMS to control this. Sometimes she gets hoarse or has a sore throat with this. No trouble swallowing. She is careful to avoid foods that make the GERD worse.    ROS: See pertinent positives and negatives per HPI.  Past Medical History:  Diagnosis Date   Allergy    Anxiety    Ectopic pregnancy    LEFT CORNUA ECTOPIC PREGNANCY   GERD (gastroesophageal reflux disease)    Migraines     Past Surgical History:  Procedure Laterality Date   AUGMENTATION MAMMAPLASTY  2003   BACK SURGERY  10/15/2021   lumbar discectomy at L5-S1 per Dr. Reyes Budge   BUNIONECTOMY  2013   right foot, per Dr. Harden    COLONOSCOPY  02/03/2019   per Dr. Shila, no polyps, int hem, repeat in 10 yrs    ENDOMETRIAL ABLATION  2008   VAPORTRODE   RHINOPLASTY     VAGINAL HYSTERECTOMY  06/26/2008   LAVH,LAPARS. BILAT SALPINECTOMY    Family History  Problem Relation Age of Onset   Lymphoma Mother 39       NON HODGKINS LYMPHOMA    Colon cancer Neg Hx    Esophageal cancer Neg Hx    Rectal cancer Neg Hx    Stomach cancer Neg Hx       Current Outpatient Medications:    ALPRAZolam  (XANAX ) 0.5 MG tablet, Take 1 tablet (0.5 mg total) by mouth 2 (two) times daily as needed for anxiety., Disp: 60 tablet, Rfl: 2   azelastine (ASTELIN) 0.1 % nasal spray, 1-2 sprays each nostril Nasally Twice a day for 30 days, Disp: , Rfl:    cetirizine (ZYRTEC) 10 MG tablet, Take 10 mg by mouth daily., Disp: , Rfl:    estradiol  (VIVELLE -DOT) 0.05 MG/24HR patch, PLACE 1 PATCH (0.05 MG TOTAL) ONTO THE SKIN 2 (TWO) TIMES A WEEK., Disp: 24 patch, Rfl: 3   omeprazole  (PRILOSEC) 40 MG capsule, Take 1 capsule (40 mg total) by mouth daily., Disp: 90 capsule, Rfl: 3   temazepam  (RESTORIL ) 15 MG capsule, TAKE ONE CAPSULE AT DINNER TIME AND ANOTHER CAPSULE AT BEDTIME, Disp: 180 capsule, Rfl: 1   UNABLE TO FIND, Med Name: Balance of Nature, Disp: , Rfl:   EXAM:  VITALS per patient if applicable:  GENERAL: alert, oriented, appears well and in no acute distress  HEENT: atraumatic, conjunttiva clear, no obvious abnormalities on inspection of external nose and ears  NECK: normal movements of the head and neck  LUNGS: on inspection no signs of respiratory distress, breathing rate appears normal, no obvious gross SOB, gasping or wheezing  CV:  no obvious cyanosis  MS: moves all visible extremities without noticeable abnormality  PSYCH/NEURO: pleasant and cooperative, no obvious depression or anxiety, speech and thought processing grossly intact  ASSESSMENT AND PLAN: GERD. We will change to Pantoprazole 40 mg, and we will increase the dosing to BID. I advised her to take this 30 minute before a meal. Follow up as needed.  Garnette Olmsted, MD  Discussed the following assessment and plan:  No diagnosis found.     I discussed the assessment and treatment plan with the patient. The patient was provided an opportunity to ask questions and all were answered. The patient agreed with the plan and demonstrated an understanding of the instructions.   The  patient was advised to call back or seek an in-person evaluation if the symptoms worsen or if the condition fails to improve as anticipated.      Review of Systems     Objective:   Physical Exam        Assessment & Plan:

## 2023-11-02 NOTE — Addendum Note (Signed)
 Addended by: JOHNNY SENIOR A on: 11/02/2023 01:09 PM   Modules accepted: Level of Service

## 2023-11-15 ENCOUNTER — Other Ambulatory Visit: Payer: Self-pay | Admitting: Obstetrics and Gynecology

## 2023-11-15 DIAGNOSIS — Z1231 Encounter for screening mammogram for malignant neoplasm of breast: Secondary | ICD-10-CM

## 2023-12-08 ENCOUNTER — Encounter: Payer: Self-pay | Admitting: Family Medicine

## 2023-12-08 ENCOUNTER — Other Ambulatory Visit (HOSPITAL_BASED_OUTPATIENT_CLINIC_OR_DEPARTMENT_OTHER): Payer: Self-pay

## 2023-12-08 DIAGNOSIS — M810 Age-related osteoporosis without current pathological fracture: Secondary | ICD-10-CM

## 2023-12-09 NOTE — Telephone Encounter (Signed)
 I placed the order for another bone density scan at the [facility]

## 2023-12-10 ENCOUNTER — Other Ambulatory Visit (HOSPITAL_BASED_OUTPATIENT_CLINIC_OR_DEPARTMENT_OTHER): Payer: Self-pay

## 2023-12-24 ENCOUNTER — Telehealth: Payer: Self-pay

## 2023-12-24 DIAGNOSIS — M810 Age-related osteoporosis without current pathological fracture: Secondary | ICD-10-CM

## 2023-12-24 DIAGNOSIS — K219 Gastro-esophageal reflux disease without esophagitis: Secondary | ICD-10-CM

## 2023-12-24 NOTE — Addendum Note (Signed)
 Addended by: JOHNNY SENIOR A on: 12/24/2023 12:44 PM   Modules accepted: Orders

## 2023-12-24 NOTE — Telephone Encounter (Signed)
 I ordered the DEXA

## 2023-12-24 NOTE — Telephone Encounter (Signed)
 Copied from CRM #[id number]. Topic: Referral - Question >> Dec 24, 2023  9:35 AM Laymon HERO wrote: Reason for CRM: Patient has GERD- wanting a referral to a  GI doctor- says the medication pantoprazole  is not working, it is starting to effect her speaking and throat

## 2023-12-24 NOTE — Addendum Note (Signed)
 Addended by: JOHNNY SENIOR A on: 12/24/2023 04:11 PM   Modules accepted: Orders

## 2023-12-24 NOTE — Telephone Encounter (Signed)
 I did the referral

## 2023-12-24 NOTE — Telephone Encounter (Signed)
 Patient informed referral was place and someone will be reaching out with scheduling details. Patient inquired if we can place orders for DEXA scan?

## 2023-12-24 NOTE — Telephone Encounter (Signed)
 Patient is aware per previous telephone conversation and message has been sent to schedulers for scheduling

## 2023-12-31 ENCOUNTER — Inpatient Hospital Stay: Admission: RE | Admit: 2023-12-31 | Source: Ambulatory Visit

## 2024-01-17 ENCOUNTER — Other Ambulatory Visit: Payer: Self-pay | Admitting: Family Medicine

## 2024-01-18 ENCOUNTER — Other Ambulatory Visit (HOSPITAL_BASED_OUTPATIENT_CLINIC_OR_DEPARTMENT_OTHER): Payer: Self-pay

## 2024-01-18 MED ORDER — TEMAZEPAM 15 MG PO CAPS
ORAL_CAPSULE | ORAL | 1 refills | Status: DC
  Filled 2024-01-18: qty 180, 90d supply, fill #0
  Filled 2024-04-17: qty 180, 90d supply, fill #1

## 2024-01-20 ENCOUNTER — Ambulatory Visit (INDEPENDENT_AMBULATORY_CARE_PROVIDER_SITE_OTHER)
Admission: RE | Admit: 2024-01-20 | Discharge: 2024-01-20 | Disposition: A | Discharge status: Home / Self Care | Source: Ambulatory Visit | Attending: Family Medicine | Admitting: Family Medicine

## 2024-01-20 ENCOUNTER — Encounter: Payer: Self-pay | Admitting: Radiology

## 2024-01-20 DIAGNOSIS — M810 Age-related osteoporosis without current pathological fracture: Secondary | ICD-10-CM | POA: Diagnosis not present

## 2024-01-21 ENCOUNTER — Ambulatory Visit: Payer: Self-pay | Admitting: Family Medicine

## 2024-02-23 ENCOUNTER — Other Ambulatory Visit (HOSPITAL_BASED_OUTPATIENT_CLINIC_OR_DEPARTMENT_OTHER): Payer: Self-pay

## 2024-02-23 DIAGNOSIS — F5104 Psychophysiologic insomnia: Secondary | ICD-10-CM | POA: Diagnosis not present

## 2024-02-23 DIAGNOSIS — Z01419 Encounter for gynecological examination (general) (routine) without abnormal findings: Secondary | ICD-10-CM | POA: Diagnosis not present

## 2024-02-23 MED ORDER — PROGESTERONE MICRONIZED 100 MG PO CAPS
100.0000 mg | ORAL_CAPSULE | Freq: Every evening | ORAL | 11 refills | Status: AC
  Filled 2024-02-23: qty 30, 30d supply, fill #0
  Filled 2024-03-20: qty 30, 30d supply, fill #1
  Filled 2024-04-17: qty 30, 30d supply, fill #2
  Filled 2024-05-19: qty 30, 30d supply, fill #3

## 2024-03-22 ENCOUNTER — Encounter: Payer: Self-pay | Admitting: Family Medicine

## 2024-04-17 ENCOUNTER — Other Ambulatory Visit (HOSPITAL_BASED_OUTPATIENT_CLINIC_OR_DEPARTMENT_OTHER): Payer: Self-pay

## 2024-04-17 ENCOUNTER — Other Ambulatory Visit: Payer: Self-pay

## 2024-04-20 ENCOUNTER — Other Ambulatory Visit (HOSPITAL_BASED_OUTPATIENT_CLINIC_OR_DEPARTMENT_OTHER): Payer: Self-pay

## 2024-05-15 ENCOUNTER — Encounter: Payer: Self-pay | Admitting: Family Medicine

## 2024-05-15 ENCOUNTER — Other Ambulatory Visit (HOSPITAL_BASED_OUTPATIENT_CLINIC_OR_DEPARTMENT_OTHER): Payer: Self-pay

## 2024-05-15 ENCOUNTER — Ambulatory Visit (INDEPENDENT_AMBULATORY_CARE_PROVIDER_SITE_OTHER): Admitting: Family Medicine

## 2024-05-15 ENCOUNTER — Ambulatory Visit: Payer: Self-pay | Admitting: Family Medicine

## 2024-05-15 VITALS — BP 98/60 | HR 74 | Temp 98.6°F | Ht 65.0 in | Wt 112.0 lb

## 2024-05-15 DIAGNOSIS — Z0000 Encounter for general adult medical examination without abnormal findings: Secondary | ICD-10-CM

## 2024-05-15 DIAGNOSIS — M8580 Other specified disorders of bone density and structure, unspecified site: Secondary | ICD-10-CM | POA: Diagnosis not present

## 2024-05-15 LAB — BASIC METABOLIC PANEL WITH GFR
BUN: 13 mg/dL (ref 6–23)
CO2: 30 meq/L (ref 19–32)
Calcium: 9.4 mg/dL (ref 8.4–10.5)
Chloride: 102 meq/L (ref 96–112)
Creatinine, Ser: 0.7 mg/dL (ref 0.40–1.20)
GFR: 97.38 mL/min
Glucose, Bld: 103 mg/dL — ABNORMAL HIGH (ref 70–99)
Potassium: 4.2 meq/L (ref 3.5–5.1)
Sodium: 137 meq/L (ref 135–145)

## 2024-05-15 LAB — LIPID PANEL
Cholesterol: 172 mg/dL (ref 28–200)
HDL: 51.1 mg/dL
LDL Cholesterol: 101 mg/dL — ABNORMAL HIGH (ref 10–99)
NonHDL: 120.76
Total CHOL/HDL Ratio: 3
Triglycerides: 97 mg/dL (ref 10.0–149.0)
VLDL: 19.4 mg/dL (ref 0.0–40.0)

## 2024-05-15 LAB — CBC WITH DIFFERENTIAL/PLATELET
Basophils Absolute: 0 K/uL (ref 0.0–0.1)
Basophils Relative: 0.3 % (ref 0.0–3.0)
Eosinophils Absolute: 0.1 K/uL (ref 0.0–0.7)
Eosinophils Relative: 1.5 % (ref 0.0–5.0)
HCT: 39.8 % (ref 36.0–46.0)
Hemoglobin: 13.6 g/dL (ref 12.0–15.0)
Lymphocytes Relative: 27.4 % (ref 12.0–46.0)
Lymphs Abs: 1.8 K/uL (ref 0.7–4.0)
MCHC: 34.1 g/dL (ref 30.0–36.0)
MCV: 85.8 fl (ref 78.0–100.0)
Monocytes Absolute: 0.3 K/uL (ref 0.1–1.0)
Monocytes Relative: 5.1 % (ref 3.0–12.0)
Neutro Abs: 4.3 K/uL (ref 1.4–7.7)
Neutrophils Relative %: 65.7 % (ref 43.0–77.0)
Platelets: 230 K/uL (ref 150.0–400.0)
RBC: 4.64 Mil/uL (ref 3.87–5.11)
RDW: 13.3 % (ref 11.5–15.5)
WBC: 6.6 K/uL (ref 4.0–10.5)

## 2024-05-15 LAB — HEPATIC FUNCTION PANEL
ALT: 15 U/L (ref 3–35)
AST: 18 U/L (ref 5–37)
Albumin: 4.5 g/dL (ref 3.5–5.2)
Alkaline Phosphatase: 48 U/L (ref 39–117)
Bilirubin, Direct: 0.1 mg/dL (ref 0.1–0.3)
Total Bilirubin: 0.6 mg/dL (ref 0.2–1.2)
Total Protein: 6.9 g/dL (ref 6.0–8.3)

## 2024-05-15 LAB — VITAMIN D 25 HYDROXY (VIT D DEFICIENCY, FRACTURES): VITD: 34.47 ng/mL (ref 30.00–100.00)

## 2024-05-15 LAB — TSH: TSH: 0.4 u[IU]/mL (ref 0.35–5.50)

## 2024-05-15 LAB — HEMOGLOBIN A1C: Hgb A1c MFr Bld: 5.4 % (ref 4.6–6.5)

## 2024-05-15 MED ORDER — ESCITALOPRAM OXALATE 20 MG PO TABS: 20.0000 mg | ORAL_TABLET | Freq: Every day | ORAL | 3 refills | Status: AC

## 2024-05-15 MED ORDER — TEMAZEPAM 15 MG PO CAPS
ORAL_CAPSULE | ORAL | 1 refills | Status: AC
  Filled 2024-05-15: qty 180, fill #0

## 2024-05-15 NOTE — Progress Notes (Signed)
" ° °  Subjective:    Patient ID: Linda [name], female    DOB: [birth year], 54 y.o.   MRN: [id number]  HPI Here for a well exam. She feels fine. She was recently started on Progesterone  by her GYN, and she thinks this helps her sleep better. She had been taking Pantoprazole  for GERD, but she stopped this. Instead she strictly monitor her diet, and the GERD has been well controlled.    Review of Systems  Constitutional: Negative.   HENT: Negative.    Eyes: Negative.   Respiratory: Negative.    Cardiovascular: Negative.   Gastrointestinal: Negative.   Genitourinary:  Negative for decreased urine volume, difficulty urinating, dyspareunia, dysuria, enuresis, flank pain, frequency, hematuria, pelvic pain and urgency.  Musculoskeletal: Negative.   Skin: Negative.   Neurological: Negative.  Negative for headaches.  Psychiatric/Behavioral: Negative.         Objective:   Physical Exam Constitutional:      General: She is not in acute distress.    Appearance: Normal appearance. She is well-developed.  HENT:     Head: Normocephalic and atraumatic.     Right Ear: External ear normal.     Left Ear: External ear normal.     Nose: Nose normal.     Mouth/Throat:     Pharynx: No oropharyngeal exudate.  Eyes:     General: No scleral icterus.    Conjunctiva/sclera: Conjunctivae normal.     Pupils: Pupils are equal, round, and reactive to light.  Neck:     Thyroid : No thyromegaly.     Vascular: No JVD.  Cardiovascular:     Rate and Rhythm: Normal rate and regular rhythm.     Pulses: Normal pulses.     Heart sounds: Normal heart sounds. No murmur heard.    No friction rub. No gallop.  Pulmonary:     Effort: Pulmonary effort is normal. No respiratory distress.     Breath sounds: Normal breath sounds. No wheezing or rales.  Chest:     Chest wall: No tenderness.  Abdominal:     General: Bowel sounds are normal. There is no distension.     Palpations: Abdomen is soft. There is no mass.      Tenderness: There is no abdominal tenderness. There is no guarding or rebound.  Musculoskeletal:        General: No tenderness. Normal range of motion.     Cervical back: Normal range of motion and neck supple.  Lymphadenopathy:     Cervical: No cervical adenopathy.  Skin:    General: Skin is warm and dry.     Findings: No erythema or rash.  Neurological:     General: No focal deficit present.     Mental Status: She is alert and oriented to person, place, and time.     Cranial Nerves: No cranial nerve deficit.     Motor: No abnormal muscle tone.     Coordination: Coordination normal.     Deep Tendon Reflexes: Reflexes are normal and symmetric. Reflexes normal.  Psychiatric:        Mood and Affect: Mood normal.        Behavior: Behavior normal.        Thought Content: Thought content normal.        Judgment: Judgment normal.           Assessment & Plan:  Well exam. We discussed diet and exercise. Get fasting labs. Garnette Olmsted, MD   "

## 2024-05-19 ENCOUNTER — Other Ambulatory Visit: Payer: Self-pay

## 2024-05-19 ENCOUNTER — Other Ambulatory Visit (HOSPITAL_BASED_OUTPATIENT_CLINIC_OR_DEPARTMENT_OTHER): Payer: Self-pay
